# Patient Record
Sex: Male | Born: 1975 | Race: Black or African American | Hispanic: No | Marital: Single | Smoking: Never smoker
Health system: Southern US, Community
[De-identification: ages and names within clinical notes are randomized; demographics above are authoritative.]

## PROBLEM LIST (undated history)

## (undated) DIAGNOSIS — I10 Essential (primary) hypertension: Secondary | ICD-10-CM

## (undated) HISTORY — DX: Essential (primary) hypertension: I10

---

## 1998-01-31 ENCOUNTER — Emergency Department (HOSPITAL_COMMUNITY): Admission: EM | Admit: 1998-01-31 | Discharge: 1998-01-31 | Payer: Self-pay | Admitting: Emergency Medicine

## 2000-10-13 ENCOUNTER — Emergency Department (HOSPITAL_COMMUNITY): Admission: EM | Admit: 2000-10-13 | Discharge: 2000-10-14 | Payer: Self-pay | Admitting: Emergency Medicine

## 2000-12-08 ENCOUNTER — Emergency Department (HOSPITAL_COMMUNITY): Admission: EM | Admit: 2000-12-08 | Discharge: 2000-12-08 | Payer: Self-pay | Admitting: Internal Medicine

## 2002-01-16 ENCOUNTER — Encounter: Payer: Self-pay | Admitting: Emergency Medicine

## 2002-01-16 ENCOUNTER — Emergency Department (HOSPITAL_COMMUNITY): Admission: EM | Admit: 2002-01-16 | Discharge: 2002-01-16 | Payer: Self-pay | Admitting: *Deleted

## 2002-01-16 ENCOUNTER — Encounter: Payer: Self-pay | Admitting: *Deleted

## 2012-07-23 ENCOUNTER — Emergency Department (HOSPITAL_COMMUNITY)
Admission: EM | Admit: 2012-07-23 | Discharge: 2012-07-23 | Disposition: A | Discharge status: Home / Self Care | Payer: Self-pay | Attending: Emergency Medicine | Admitting: Emergency Medicine

## 2012-07-23 ENCOUNTER — Emergency Department (HOSPITAL_COMMUNITY): Payer: Self-pay

## 2012-07-23 ENCOUNTER — Encounter (HOSPITAL_COMMUNITY): Payer: Self-pay | Admitting: *Deleted

## 2012-07-23 DIAGNOSIS — S6990XA Unspecified injury of unspecified wrist, hand and finger(s), initial encounter: Secondary | ICD-10-CM | POA: Insufficient documentation

## 2012-07-23 DIAGNOSIS — F17200 Nicotine dependence, unspecified, uncomplicated: Secondary | ICD-10-CM | POA: Insufficient documentation

## 2012-07-23 DIAGNOSIS — S0010XA Contusion of unspecified eyelid and periocular area, initial encounter: Secondary | ICD-10-CM | POA: Insufficient documentation

## 2012-07-23 LAB — CBC WITH DIFFERENTIAL/PLATELET
Basophils Absolute: 0 10*3/uL (ref 0.0–0.1)
Basophils Relative: 0 % (ref 0–1)
Eosinophils Absolute: 0.1 10*3/uL (ref 0.0–0.7)
Eosinophils Relative: 1 % (ref 0–5)
HCT: 41.3 % (ref 39.0–52.0)
Hemoglobin: 14.3 g/dL (ref 13.0–17.0)
Lymphocytes Relative: 20 % (ref 12–46)
Lymphs Abs: 2.1 10*3/uL (ref 0.7–4.0)
MCH: 27.9 pg (ref 26.0–34.0)
MCHC: 34.6 g/dL (ref 30.0–36.0)
MCV: 80.5 fL (ref 78.0–100.0)
Monocytes Absolute: 0.7 10*3/uL (ref 0.1–1.0)
Monocytes Relative: 7 % (ref 3–12)
Neutro Abs: 7.3 10*3/uL (ref 1.7–7.7)
Neutrophils Relative %: 72 % (ref 43–77)
Platelets: 297 10*3/uL (ref 150–400)
RBC: 5.13 MIL/uL (ref 4.22–5.81)
RDW: 13.3 % (ref 11.5–15.5)
WBC: 10.2 10*3/uL (ref 4.0–10.5)

## 2012-07-23 LAB — BASIC METABOLIC PANEL
BUN: 10 mg/dL (ref 6–23)
CO2: 29 mEq/L (ref 19–32)
Calcium: 9.5 mg/dL (ref 8.4–10.5)
Chloride: 106 mEq/L (ref 96–112)
Creatinine, Ser: 1.1 mg/dL (ref 0.50–1.35)
GFR calc Af Amer: >90 mL/min (ref >90)
GFR calc non Af Amer: 85 mL/min — ABNORMAL LOW (ref >90)
Glucose, Bld: 92 mg/dL (ref 70–99)
Potassium: 3.7 mEq/L (ref 3.5–5.1)
Sodium: 143 mEq/L (ref 135–145)

## 2012-07-23 MED ORDER — AMOXICILLIN-POT CLAVULANATE 875-125 MG PO TABS: 1.0000 | ORAL_TABLET | Freq: Two times a day (BID) | ORAL | Status: DC

## 2012-07-23 MED ORDER — AMPICILLIN-SULBACTAM SODIUM 3 (2-1) G IJ SOLR
3.0000 g | Freq: Once | INTRAMUSCULAR | Status: AC
  Administered 2012-07-23: 3 g via INTRAVENOUS
  Filled 2012-07-23: qty 3

## 2012-07-23 MED ORDER — OXYCODONE-ACETAMINOPHEN 5-325 MG PO TABS: 1.0000 | ORAL_TABLET | ORAL | Status: DC | PRN
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
## 2014-11-22 ENCOUNTER — Other Ambulatory Visit (HOSPITAL_COMMUNITY)
Admission: RE | Admit: 2014-11-22 | Discharge: 2014-11-22 | Disposition: A | Discharge status: Home / Self Care | Payer: Self-pay | Source: Ambulatory Visit | Attending: Family Medicine | Admitting: Family Medicine

## 2014-11-22 ENCOUNTER — Emergency Department (HOSPITAL_COMMUNITY)
Admission: EM | Admit: 2014-11-22 | Discharge: 2014-11-22 | Disposition: A | Discharge status: Home / Self Care | Payer: Self-pay | Source: Home / Self Care | Attending: Family Medicine | Admitting: Family Medicine

## 2014-11-22 ENCOUNTER — Encounter (HOSPITAL_COMMUNITY): Payer: Self-pay | Admitting: Emergency Medicine

## 2014-11-22 DIAGNOSIS — Z113 Encounter for screening for infections with a predominantly sexual mode of transmission: Secondary | ICD-10-CM | POA: Insufficient documentation

## 2014-11-22 DIAGNOSIS — IMO0001 Reserved for inherently not codable concepts without codable children: Secondary | ICD-10-CM

## 2014-11-22 DIAGNOSIS — J329 Chronic sinusitis, unspecified: Secondary | ICD-10-CM

## 2014-11-22 DIAGNOSIS — R0982 Postnasal drip: Secondary | ICD-10-CM

## 2014-11-22 DIAGNOSIS — R030 Elevated blood-pressure reading, without diagnosis of hypertension: Secondary | ICD-10-CM

## 2014-11-22 DIAGNOSIS — R059 Cough, unspecified: Secondary | ICD-10-CM

## 2014-11-22 DIAGNOSIS — R05 Cough: Secondary | ICD-10-CM

## 2014-11-22 DIAGNOSIS — Z202 Contact with and (suspected) exposure to infections with a predominantly sexual mode of transmission: Secondary | ICD-10-CM

## 2014-11-22 MED ORDER — CEFTRIAXONE SODIUM 250 MG IJ SOLR
250.0000 mg | Freq: Once | INTRAMUSCULAR | Status: AC
  Administered 2014-11-22: 250 mg via INTRAMUSCULAR

## 2014-11-22 MED ORDER — CEFTRIAXONE SODIUM 250 MG IJ SOLR
INTRAMUSCULAR | Status: AC
  Filled 2014-11-22: qty 250

## 2014-11-22 MED ORDER — AZITHROMYCIN 250 MG PO TABS
ORAL_TABLET | ORAL | Status: AC
  Filled 2014-11-22: qty 4

## 2014-11-22 MED ORDER — METRONIDAZOLE 500 MG PO TABS: 500.0000 mg | ORAL_TABLET | Freq: Two times a day (BID) | ORAL | Status: DC

## 2014-11-22 MED ORDER — AZITHROMYCIN 250 MG PO TABS
1000.0000 mg | ORAL_TABLET | Freq: Once | ORAL | Status: AC
  Administered 2014-11-22: 1000 mg via ORAL

## 2014-11-22 MED ORDER — IPRATROPIUM BROMIDE 0.06 % NA SOLN
2.0000 | Freq: Four times a day (QID) | NASAL | Status: DC
Start: 2014-11-22 — End: 2016-12-22

## 2014-11-22 MED ORDER — LIDOCAINE HCL (PF) 1 % IJ SOLN
INTRAMUSCULAR | Status: AC
  Filled 2014-11-22: qty 5

## 2014-11-22 NOTE — Discharge Instructions (Signed)
You have been treated for gonorrhea and chlamydia at [facility] today. Please use medications as prescribed to treat possible exposure to trichomonas and for your post nasal drainage. No sex x 2 weeks. Use condoms. You should also be aware that your blood pressure is quite elevated (170/118) and this should be evaluated and treated by the primary care provider of your choice as soon as possible. No alcohol while taking metronidazole.   Cough, Adult  A cough is a reflex that helps clear your throat and airways. It can help heal the body or may be a reaction to an irritated airway. A cough may only last 2 or 3 weeks (acute) or may last more than 8 weeks (chronic).  CAUSES Acute cough:  Viral or bacterial infections. Chronic cough:  Infections.  Allergies.  Asthma.  Post-nasal drip.  Smoking.  Heartburn or acid reflux.  Some medicines.  Chronic lung problems (COPD).  Cancer. SYMPTOMS   Cough.  Fever.  Chest pain.  Increased breathing rate.  High-pitched whistling sound when breathing (wheezing).  Colored mucus that you cough up (sputum). TREATMENT   A bacterial cough may be treated with antibiotic medicine.  A viral cough must run its course and will not respond to antibiotics.  Your caregiver may recommend other treatments if you have a chronic cough. HOME CARE INSTRUCTIONS   Only take over-the-counter or prescription medicines for pain, discomfort, or fever as directed by your caregiver. Use cough suppressants only as directed by your caregiver.  Use a cold steam vaporizer or humidifier in your bedroom or home to help loosen secretions.  Sleep in a semi-upright position if your cough is worse at night.  Rest as needed.  Stop smoking if you smoke. SEEK IMMEDIATE MEDICAL CARE IF:   You have pus in your sputum.  Your cough starts to worsen.  You cannot control your cough with suppressants and are losing sleep.  You begin coughing up blood.  You have difficulty  breathing.  You develop pain which is getting worse or is uncontrolled with medicine.  You have a fever. MAKE SURE YOU:   Understand these instructions.  Will watch your condition.  Will get help right away if you are not doing well or get worse. Document Released: 03/01/2011 Document Revised: 11/25/2011 Document Reviewed: 03/01/2011 [facility] Patient Information 2015 [place], [place]. This information is not intended to replace advice given to you by your health care provider. Make sure you discuss any questions you have with your health care provider.  Hypertension Hypertension, commonly called high blood pressure, is when the force of blood pumping through your arteries is too strong. Your arteries are the blood vessels that carry blood from your heart throughout your body. A blood pressure reading consists of a higher number over a lower number, such as 110/72. The higher number (systolic) is the pressure inside your arteries when your heart pumps. The lower number (diastolic) is the pressure inside your arteries when your heart relaxes. Ideally you want your blood pressure below 120/80. Hypertension forces your heart to work harder to pump blood. Your arteries may become narrow or stiff. Having hypertension puts you at risk for heart disease, stroke, and other problems.  RISK FACTORS Some risk factors for high blood pressure are controllable. Others are not.  Risk factors you cannot control include:   Race. You may be at higher risk if you are African American.  Age. Risk increases with age.  Gender. Men are at higher risk than women before age  45 years. After age 56, women are at higher risk than men. Risk factors you can control include:  Not getting enough exercise or physical activity.  Being overweight.  Getting too much fat, sugar, calories, or salt in your diet.  Drinking too much alcohol. SIGNS AND SYMPTOMS Hypertension does not usually cause signs or symptoms.  Extremely high blood pressure (hypertensive crisis) may cause headache, anxiety, shortness of breath, and nosebleed. DIAGNOSIS  To check if you have hypertension, your health care provider will measure your blood pressure while you are seated, with your arm held at the level of your heart. It should be measured at least twice using the same arm. Certain conditions can cause a difference in blood pressure between your right and left arms. A blood pressure reading that is higher than normal on one occasion does not mean that you need treatment. If one blood pressure reading is high, ask your health care provider about having it checked again. TREATMENT  Treating high blood pressure includes making lifestyle changes and possibly taking medicine. Living a healthy lifestyle can help lower high blood pressure. You may need to change some of your habits. Lifestyle changes may include:  Following the DASH diet. This diet is high in fruits, vegetables, and whole grains. It is low in salt, red meat, and added sugars.  Getting at least 2 hours of brisk physical activity every week.  Losing weight if necessary.  Not smoking.  Limiting alcoholic beverages.  Learning ways to reduce stress. If lifestyle changes are not enough to get your blood pressure under control, your health care provider may prescribe medicine. You may need to take more than one. Work closely with your health care provider to understand the risks and benefits. HOME CARE INSTRUCTIONS  Have your blood pressure rechecked as directed by your health care provider.   Take medicines only as directed by your health care provider. Follow the directions carefully. Blood pressure medicines must be taken as prescribed. The medicine does not work as well when you skip doses. Skipping doses also puts you at risk for problems.   Do not smoke.   Monitor your blood pressure at home as directed by your health care provider. SEEK MEDICAL CARE  IF:   You think you are having a reaction to medicines taken.  You have recurrent headaches or feel dizzy.  You have swelling in your ankles.  You have trouble with your vision. SEEK IMMEDIATE MEDICAL CARE IF:  You develop a severe headache or confusion.  You have unusual weakness, numbness, or feel faint.  You have severe chest or abdominal pain.  You vomit repeatedly.  You have trouble breathing. MAKE SURE YOU:   Understand these instructions.  Will watch your condition.  Will get help right away if you are not doing well or get worse. Document Released: 09/02/2005 Document Revised: 01/17/2014 Document Reviewed: 06/25/2013 [facility] Patient Information 2015 [place], [place]. This information is not intended to replace advice given to you by your health care provider. Make sure you discuss any questions you have with your health care provider.  Managing Your High Blood Pressure Blood pressure is a measurement of how forceful your blood is pressing against the walls of the arteries. Arteries are muscular tubes within the circulatory system. Blood pressure does not stay the same. Blood pressure rises when you are active, excited, or nervous; and it lowers during sleep and relaxation. If the numbers measuring your blood pressure stay above normal most of the time, you  are at risk for health problems. High blood pressure (hypertension) is a long-term (chronic) condition in which blood pressure is elevated. A blood pressure reading is recorded as two numbers, such as 120 over 80 (or 120/80). The first, higher number is called the systolic pressure. It is a measure of the pressure in your arteries as the heart beats. The second, lower number is called the diastolic pressure. It is a measure of the pressure in your arteries as the heart relaxes between beats.  Keeping your blood pressure in a normal range is important to your overall health and prevention of health problems, such as heart  disease and stroke. When your blood pressure is uncontrolled, your heart has to work harder than normal. High blood pressure is a very common condition in adults because blood pressure tends to rise with age. Men and women are equally likely to have hypertension but at different times in life. Before age 67, men are more likely to have hypertension. After 39 years of age, women are more likely to have it. Hypertension is especially common in African Americans. This condition often has no signs or symptoms. The cause of the condition is usually not known. Your caregiver can help you come up with a plan to keep your blood pressure in a normal, healthy range. BLOOD PRESSURE STAGES Blood pressure is classified into four stages: normal, prehypertension, stage 1, and stage 2. Your blood pressure reading will be used to determine what type of treatment, if any, is necessary. Appropriate treatment options are tied to these four stages:  Normal  Systolic pressure (mm Hg): below 120.  Diastolic pressure (mm Hg): below 80. Prehypertension  Systolic pressure (mm Hg): 120 to 139.  Diastolic pressure (mm Hg): 80 to 89. Stage1  Systolic pressure (mm Hg): 140 to 159.  Diastolic pressure (mm Hg): 90 to 99. Stage2  Systolic pressure (mm Hg): 160 or above.  Diastolic pressure (mm Hg): 100 or above. RISKS RELATED TO HIGH BLOOD PRESSURE Managing your blood pressure is an important responsibility. Uncontrolled high blood pressure can lead to:  A heart attack.  A stroke.  A weakened blood vessel (aneurysm).  Heart failure.  Kidney damage.  Eye damage.  Metabolic syndrome.  Memory and concentration problems. HOW TO MANAGE YOUR BLOOD PRESSURE Blood pressure can be managed effectively with lifestyle changes and medicines (if needed). Your caregiver will help you come up with a plan to bring your blood pressure within a normal range. Your plan should include the following: Education  Read all  information provided by your caregivers about how to control blood pressure.  Educate yourself on the latest guidelines and treatment recommendations. New research is always being done to further define the risks and treatments for high blood pressure. Lifestylechanges  Control your weight.  Avoid smoking.  Stay physically active.  Reduce the amount of salt in your diet.  Reduce stress.  Control any chronic conditions, such as high cholesterol or diabetes.  Reduce your alcohol intake. Medicines  Several medicines (antihypertensive medicines) are available, if needed, to bring blood pressure within a normal range. Communication  Review all the medicines you take with your caregiver because there may be side effects or interactions.  Talk with your caregiver about your diet, exercise habits, and other lifestyle factors that may be contributing to high blood pressure.  See your caregiver regularly. Your caregiver can help you create and adjust your plan for managing high blood pressure. RECOMMENDATIONS FOR TREATMENT AND FOLLOW-UP  The following  recommendations are based on current guidelines for managing high blood pressure in nonpregnant adults. Use these recommendations to identify the proper follow-up period or treatment option based on your blood pressure reading. You can discuss these options with your caregiver.  Systolic pressure of 120 to 139 or diastolic pressure of 80 to 89: Follow up with your caregiver as directed.  Systolic pressure of 140 to 160 or diastolic pressure of 90 to 100: Follow up with your caregiver within 2 months.  Systolic pressure above 160 or diastolic pressure above 100: Follow up with your caregiver within 1 month.  Systolic pressure above 180 or diastolic pressure above 110: Consider antihypertensive therapy; follow up with your caregiver within 1 week.  Systolic pressure above 200 or diastolic pressure above 120: Begin antihypertensive therapy;  follow up with your caregiver within 1 week. Document Released: 05/27/2012 Document Reviewed: 05/27/2012 [facility] Patient Information 2015 [place], [place]. This information is not intended to replace advice given to you by your health care provider. Make sure you discuss any questions you have with your health care provider.  Safe Sex Safe sex is about reducing the risk of giving or getting a sexually transmitted disease (STD). STDs are spread through sexual contact involving the genitals, mouth, or rectum. Some STDs can be cured and others cannot. Safe sex can also prevent unintended pregnancies.  WHAT ARE SOME SAFE SEX PRACTICES?  Limit your sexual activity to only one partner who is having sex with only you.  Talk to your partner about his or her past partners, past STDs, and drug use.  Use a condom every time you have sexual intercourse. This includes vaginal, oral, and anal sexual activity. Both females and males should wear condoms during oral sex. Only use latex or polyurethane condoms and water-based lubricants. Using petroleum-based lubricants or oils to lubricate a condom will weaken the condom and increase the chance that it will break. The condom should be in place from the beginning to the end of sexual activity. Wearing a condom reduces, but does not completely eliminate, your risk of getting or giving an STD. STDs can be spread by contact with infected body fluids and skin.  Get vaccinated for hepatitis B and HPV.  Avoid alcohol and recreational drugs, which can affect your judgment. You may forget to use a condom or participate in high-risk sex.  For females, avoid douching after sexual intercourse. Douching can spread an infection farther into the reproductive tract.  Check your body for signs of sores, blisters, rashes, or unusual discharge. See your health care provider if you notice any of these signs.  Avoid sexual contact if you have symptoms of an infection or are being  treated for an STD. If you or your partner has herpes, avoid sexual contact when blisters are present. Use condoms at all other times.  If you are at risk of being infected with HIV, it is recommended that you take a prescription medicine daily to prevent HIV infection. This is called pre-exposure prophylaxis (PrEP). You are considered at risk if:  You are a man who has sex with other men (MSM).  You are a heterosexual man or woman who is sexually active with more than one partner.  You take drugs by injection.  You are sexually active with a partner who has HIV.  Talk with your health care provider about whether you are at high risk of being infected with HIV. If you choose to begin PrEP, you should first be tested for HIV.  You should then be tested every 3 months for as long as you are taking PrEP.  See your health care provider for regular screenings, exams, and tests for other STDs. Before having sex with a new partner, each of you should be screened for STDs and should talk about the results with each other. WHAT ARE THE BENEFITS OF SAFE SEX?   There is less chance of getting or giving an STD.  You can prevent unwanted or unintended pregnancies.  By discussing safe sex concerns with your partner, you may increase feelings of intimacy, comfort, trust, and honesty between the two of you. Document Released: 10/10/2004 Document Revised: 01/17/2014 Document Reviewed: 02/24/2012 [facility] Patient Information 2015 [place]. This information is not intended to replace advice given to you by your health care provider. Make sure you discuss any questions you have with your health care provider.  Sexually Transmitted Disease A sexually transmitted disease (STD) is a disease or infection that may be passed (transmitted) from person to person, usually during sexual activity. This may happen by way of saliva, semen, blood, vaginal mucus, or urine. Common STDs include:   Gonorrhea.    Chlamydia.   Syphilis.   HIV and AIDS.   Genital herpes.   Hepatitis B and C.   Trichomonas.   Human papillomavirus (HPV).   Pubic lice.   Scabies.  Mites.  Bacterial vaginosis. WHAT ARE CAUSES OF STDs? An STD may be caused by bacteria, a virus, or parasites. STDs are often transmitted during sexual activity if one person is infected. However, they may also be transmitted through nonsexual means. STDs may be transmitted after:   Sexual intercourse with an infected person.   Sharing sex toys with an infected person.   Sharing needles with an infected person or using unclean piercing or tattoo needles.  Having intimate contact with the genitals, mouth, or rectal areas of an infected person.   Exposure to infected fluids during birth. WHAT ARE THE SIGNS AND SYMPTOMS OF STDs? Different STDs have different symptoms. Some people may not have any symptoms. If symptoms are present, they may include:   Painful or bloody urination.   Pain in the pelvis, abdomen, vagina, anus, throat, or eyes.   A skin rash, itching, or irritation.  Growths, ulcerations, blisters, or sores in the genital and anal areas.  Abnormal vaginal discharge with or without bad odor.   Penile discharge in men.   Fever.   Pain or bleeding during sexual intercourse.   Swollen glands in the groin area.   Yellow skin and eyes (jaundice). This is seen with hepatitis.   Swollen testicles.  Infertility.  Sores and blisters in the mouth. HOW ARE STDs DIAGNOSED? To make a diagnosis, your health care provider may:   Take a medical history.   Perform a physical exam.   Take a sample of any discharge to examine.  Swab the throat, cervix, opening to the penis, rectum, or vagina for testing.  Test a sample of your first morning urine.   Perform blood tests.   Perform a Pap test, if this applies.   Perform a colposcopy.   Perform a laparoscopy.  HOW ARE STDs  TREATED? Treatment depends on the STD. Some STDs may be treated but not cured.   Chlamydia, gonorrhea, trichomonas, and syphilis can be cured with antibiotic medicine.   Genital herpes, hepatitis, and HIV can be treated, but not cured, with prescribed medicines. The medicines lessen symptoms.   Genital warts from HPV can  be treated with medicine or by freezing, burning (electrocautery), or surgery. Warts may come back.   HPV cannot be cured with medicine or surgery. However, abnormal areas may be removed from the cervix, vagina, or vulva.   If your diagnosis is confirmed, your recent sexual partners need treatment. This is true even if they are symptom-free or have a negative culture or evaluation. They should not have sex until their health care providers say it is okay. HOW CAN I REDUCE MY RISK OF GETTING AN STD? Take these steps to reduce your risk of getting an STD:  Use latex condoms, dental dams, and water-soluble lubricants during sexual activity. Do not use petroleum jelly or oils.  Avoid having multiple sex partners.  Do not have sex with someone who has other sex partners.  Do not have sex with anyone you do not know or who is at high risk for an STD.  Avoid risky sex practices that can break your skin.  Do not have sex if you have open sores on your mouth or skin.  Avoid drinking too much alcohol or taking illegal drugs. Alcohol and drugs can affect your judgment and put you in a vulnerable position.  Avoid engaging in oral and anal sex acts.  Get vaccinated for HPV and hepatitis. If you have not received these vaccines in the past, talk to your health care provider about whether one or both might be right for you.   If you are at risk of being infected with HIV, it is recommended that you take a prescription medicine daily to prevent HIV infection. This is called pre-exposure prophylaxis (PrEP). You are considered at risk if:  You are a man who has sex with other  men (MSM).  You are a heterosexual man or woman and are sexually active with more than one partner.  You take drugs by injection.  You are sexually active with a partner who has HIV.  Talk with your health care provider about whether you are at high risk of being infected with HIV. If you choose to begin PrEP, you should first be tested for HIV. You should then be tested every 3 months for as long as you are taking PrEP.  WHAT SHOULD I DO IF I THINK I HAVE AN STD?  See your health care provider.   Tell your sexual partner(s). They should be tested and treated for any STDs.  Do not have sex until your health care provider says it is okay. WHEN SHOULD I GET IMMEDIATE MEDICAL CARE? Contact your health care provider right away if:   You have severe abdominal pain.  You are a man and notice swelling or pain in your testicles.  You are a woman and notice swelling or pain in your vagina. Document Released: 11/23/2002 Document Revised: 09/07/2013 Document Reviewed: 03/23/2013 [facility] Patient Information 2015 [place], [place]. This information is not intended to replace advice given to you by your health care provider. Make sure you discuss any questions you have with your health care provider.

## 2014-11-22 NOTE — ED Provider Notes (Signed)
CSN: [id number]     Arrival date & time 11/22/14  1030 History   First MD Initiated Contact with Patient 11/22/14 1053  [id number]   Chief Complaint  Patient presents with  . Cough  . Exposure to STD   (Consider location/radiation/quality/duration/timing/severity/associated sxs/prior Treatment) HPI Comments: Patient requests evaluation of two issues. First he states he has had several weeks of post nasal drainage that precipitates a cough. Has tried using occasional dose of Zyrtec with limited relief.  Next he mentions that his girlfriend recently informed him that she tested positive for trichomonas and he now requests STI testing. Reports himself to be symptom free. No genital lesions, penile discharge or dysuria.  Reports himself to be otherwise healthy.   Patient is a 39 y.o. male presenting with STD exposure. The history is provided by the patient.  Exposure to STD    History reviewed. No pertinent past medical history. History reviewed. No pertinent past surgical history. History reviewed. No pertinent family history. History  Substance Use Topics  . Smoking status: Current Every Day Smoker  . Smokeless tobacco: Not on file  . Alcohol Use: Yes    Review of Systems  All other systems reviewed and are negative.   Allergies  Review of patient's allergies indicates no known allergies.  Home Medications   Prior to Admission medications   Medication Sig Start Date End Date Taking? Authorizing Provider  amoxicillin-clavulanate (AUGMENTIN) 875-125 MG per tablet Take 1 tablet by mouth 2 (two) times daily. 07/23/12   Raeford RazorStephen Kohut, MD  Aspirin-Caffeine [phone] [id number] MG PACK Take by mouth once. Pain    Historical Provider, MD  ipratropium (ATROVENT) 0.06 % nasal spray Place 2 sprays into both nostrils 4 (four) times daily. For nasal congestion 11/22/14   Mathis FareJennifer Lee H Marguarite Markov, PA  metroNIDAZOLE (FLAGYL) 500 MG tablet Take 1 tablet (500 mg total) by mouth 2 (two) times daily. 11/22/14   Mathis FareJennifer Lee H  Jeyden Coffelt, PA  oxyCODONE-acetaminophen (PERCOCET/ROXICET) 5-325 MG per tablet Take 1 tablet by mouth every 4 (four) hours as needed for pain. 07/23/12   Raeford RazorStephen Kohut, MD   BP 170/118 mmHg  Pulse 85  Temp(Src) 98.7 F (37.1 C) (Oral)  Resp 16  SpO2 95% Physical Exam  Constitutional: He is oriented to person, place, and time. He appears well-developed and well-nourished. No distress.  HENT:  Head: Normocephalic and atraumatic.  Right Ear: Hearing, tympanic membrane, external ear and ear canal normal.  Left Ear: Hearing, tympanic membrane, external ear and ear canal normal.  Nose: Nose normal.  Mouth/Throat: Uvula is midline, oropharynx is clear and moist and mucous membranes are normal.  Cardiovascular: Normal rate, regular rhythm and normal heart sounds.   Pulmonary/Chest: Effort normal and breath sounds normal.  Genitourinary:  Declines this portion of exam  Neurological: He is alert and oriented to person, place, and time.  Skin: Skin is warm and dry.  Psychiatric: He has a normal mood and affect. His behavior is normal.  Nursing note and vitals reviewed.   ED Course  Procedures (including critical care time) Labs Review Labs Reviewed  URINE CYTOLOGY ANCILLARY ONLY    Imaging Review No results found.   MDM   1. Post-nasal drainage   2. Cough   3. Exposure to STD   4. Elevated blood pressure   [facility]ith[facility] 1000mg  and ceftriaxone 250 mg IM. Metronidazole for trich exposure and Atrovent for nasal drainage. No oral decongestants advised given patient's elevated  blood pressure. You have been treated for gonorrhea and chlamydia at [facility] today. Please use medications as prescribed to treat possible exposure to trichomonas and for your post nasal drainage. No sex x 2 weeks. Use condoms. You should also be aware that your blood pressure is quite elevated (170/118) and this should be evaluated and treated by the primary care  provider of your choice as soon as possible. No alcohol while taking metronidazole.     Ria Clock, [place] 11/22/14 [phone]

## 2014-11-22 NOTE — ED Notes (Signed)
C/o  Non productive cough, post nasal drip  x 2 weeks.   Pt having no relief with doing zyrtec daily and using nite quil.    Pt also reports being told by sexual partner that she tested positive for trich.  Pt was informed 10 days ago.  Pt is asymptomatic at this time.

## 2014-11-23 LAB — URINE CYTOLOGY ANCILLARY ONLY
CHLAMYDIA, DNA PROBE: NEGATIVE
Neisseria Gonorrhea: NEGATIVE
TRICH (WINDOWPATH): NEGATIVE

## 2014-11-23 NOTE — ED Notes (Signed)
Trichomonas, GC, chlamydia negative. No further action required

## 2016-12-22 ENCOUNTER — Encounter (HOSPITAL_COMMUNITY): Payer: Self-pay | Admitting: Emergency Medicine

## 2016-12-22 ENCOUNTER — Ambulatory Visit (HOSPITAL_COMMUNITY)
Admission: EM | Admit: 2016-12-22 | Discharge: 2016-12-22 | Disposition: A | Discharge status: Home / Self Care | Payer: Self-pay | Attending: Family Medicine | Admitting: Family Medicine

## 2016-12-22 DIAGNOSIS — L0231 Cutaneous abscess of buttock: Secondary | ICD-10-CM

## 2016-12-22 MED ORDER — DOXYCYCLINE HYCLATE 100 MG PO CAPS: 100.0000 mg | ORAL_CAPSULE | Freq: Two times a day (BID) | ORAL | 0 refills | Status: DC

## 2016-12-22 NOTE — Discharge Instructions (Signed)
Can do warm tub soaks 2-3 times daily.  Complete the full course of antibiotic that was given today.

## 2016-12-22 NOTE — ED Provider Notes (Signed)
CSN: [id number]     Arrival date & time 12/22/16  1435 History   None    Chief Complaint  Patient presents with  . Abscess   (Consider location/radiation/quality/duration/timing/severity/associated sxs/prior Treatment) HPI 41 year old black male comes in today with complaints of large left buttock abscess. States that he noticed this about 3-4 days ago. No injury. Pain mostly when he is sitting. Has not had this problem before onset of symptoms. Denies drainage from the area. Denies fever, chills, bowel or bladder changes. History reviewed. No pertinent past medical history. History reviewed. No pertinent surgical history. History reviewed. No pertinent family history. Social History  Substance Use Topics  . Smoking status: Current Every Day Smoker  . Smokeless tobacco: Never Used  . Alcohol use Yes    Review of Systems  Constitutional: Negative.   HENT: Negative.   Respiratory: Negative.   Gastrointestinal: Negative.   Genitourinary: Negative.   Skin:       Abscess left buttock  Neurological: Negative.   Psychiatric/Behavioral: Negative.     Allergies  Patient has no known allergies.  Home Medications   Prior to Admission medications   Medication Sig Start Date End Date Taking? Authorizing Provider  doxycycline (VIBRAMYCIN) 100 MG capsule Take 1 capsule (100 mg total) by mouth 2 (two) times daily. 12/22/16   Naida Sleight, PA-C   Meds Ordered and Administered this Visit  Medications - No data to display  BP (!) 163/106 (BP Location: Right Arm)   Pulse 92   Temp 98.7 F (37.1 C) (Oral)   Resp 18   SpO2 99%  No data found.   Physical Exam  Constitutional: He is oriented to person, place, and time. He appears well-developed. No distress.  HENT:  Head: Normocephalic and atraumatic.  Eyes: EOM are normal. Pupils are equal, round, and reactive to light.  Neck: Normal range of motion.  Pulmonary/Chest: Effort normal. No respiratory distress.  Abdominal: He exhibits  no distension. There is no tenderness.  Musculoskeletal: Normal range of motion.  Neurological: He is alert and oriented to person, place, and time.  Skin: Skin is warm.  Patient is a large tender gluteal abscess about 4 cm in diameter. No drainage.  Psychiatric: He has a normal mood and affect.    Urgent Care Course     Procedures (including critical care time)  Labs Review Labs Reviewed - No data to display  Imaging Review No results found.   Visual Acuity Review  Right Eye Distance:   Left Eye Distance:   Bilateral Distance:    Right Eye Near:   Left Eye Near:    Bilateral Near:         MDM   1. Abscess, gluteal, left    Today the patient sent area of left gluteal abscess was prepped with Betadine and using 1 mL 1% Xylocaine with epi for local anesthetic I was able to aspirate about 5 mL of bloody purulent material. Tolerated procedure without complication. Patient will do warm tub soaks 2-3 times daily. I did give her prescription for doxycycline 100 mg by mouth twice a day 10 days. If not better or symptoms worsen over the next 4-5 days he'll return back to our clinic. All questions answered.    Naida Sleight, PA-C 12/22/16 [phone]

## 2016-12-22 NOTE — ED Triage Notes (Signed)
The patient presented to the [facility] with a complaint of a possible abscess on the top of his buttocks x 3 days.

## 2017-03-17 ENCOUNTER — Ambulatory Visit (HOSPITAL_COMMUNITY)
Admission: EM | Admit: 2017-03-17 | Discharge: 2017-03-17 | Disposition: A | Discharge status: Home / Self Care | Payer: Self-pay | Attending: Family Medicine | Admitting: Family Medicine

## 2017-03-17 ENCOUNTER — Encounter (HOSPITAL_COMMUNITY): Payer: Self-pay | Admitting: Family Medicine

## 2017-03-17 DIAGNOSIS — S76212A Strain of adductor muscle, fascia and tendon of left thigh, initial encounter: Secondary | ICD-10-CM

## 2017-03-17 MED ORDER — IBUPROFEN 800 MG PO TABS: 800.0000 mg | ORAL_TABLET | Freq: Three times a day (TID) | ORAL | 0 refills | Status: DC

## 2017-03-17 NOTE — ED Triage Notes (Signed)
Pt for left groin pain and left upper leg pain and weakness x a few days. Denies injury

## 2017-03-17 NOTE — ED Provider Notes (Signed)
CSN: [id number]     Arrival date & time 03/17/17  1201 History   First MD Initiated Contact with Patient 03/17/17 1254     Chief Complaint  Patient presents with  . Groin Pain   (Consider location/radiation/quality/duration/timing/severity/associated sxs/prior Treatment) Patient c/o left groin pain x 2 days   The history is provided by the patient.  Groin Pain  This is a new problem. The current episode started more than 2 days ago. The problem occurs constantly. The problem has not changed since onset.Nothing aggravates the symptoms. Nothing relieves the symptoms. He has tried nothing for the symptoms.    History reviewed. No pertinent past medical history. History reviewed. No pertinent surgical history. History reviewed. No pertinent family history. Social History  Substance Use Topics  . Smoking status: Current Every Day Smoker  . Smokeless tobacco: Never Used  . Alcohol use Yes    Review of Systems  Constitutional: Negative.   HENT: Negative.   Eyes: Negative.   Respiratory: Negative.   Cardiovascular: Negative.   Gastrointestinal: Negative.   Endocrine: Negative.   Genitourinary: Negative.   Musculoskeletal: Positive for arthralgias.  Allergic/Immunologic: Negative.   Neurological: Negative.   Hematological: Negative.   Psychiatric/Behavioral: Negative.     Allergies  Patient has no known allergies.  Home Medications   Prior to Admission medications   Medication Sig Start Date End Date Taking? Authorizing Provider  doxycycline (VIBRAMYCIN) 100 MG capsule Take 1 capsule (100 mg total) by mouth 2 (two) times daily. 12/22/16   Naida Sleightwens, James M, PA-C  ibuprofen (ADVIL,MOTRIN) 800 MG tablet Take 1 tablet (800 mg total) by mouth 3 (three) times daily. 03/17/17   Deatra Canterxford, William J, FNP   Meds Ordered and Administered this Visit  Medications - No data to display  BP (!) 164/93 (BP Location: Left Arm) Comment: rn notified   Pulse 99   Temp 98.5 F (36.9 C) (Oral)    Resp 16   SpO2 97%  No data found.   Physical Exam  Constitutional: He is oriented to person, place, and time. He appears well-developed and well-nourished.  HENT:  Head: Normocephalic and atraumatic.  Eyes: Conjunctivae and EOM are normal. Pupils are equal, round, and reactive to light.  Neck: Normal range of motion. Neck supple.  Cardiovascular: Normal rate, regular rhythm and normal heart sounds.   Pulmonary/Chest: Effort normal and breath sounds normal.  Abdominal: Soft. Bowel sounds are normal.  Musculoskeletal: Normal range of motion.  Neurological: He is alert and oriented to person, place, and time.  Vitals reviewed.   Urgent Care Course     Procedures (including critical care time)  Labs Review Labs Reviewed  URINE CULTURE    Imaging Review No results found.   Visual Acuity Review  Right Eye Distance:   Left Eye Distance:   Bilateral Distance:    Right Eye Near:   Left Eye Near:    Bilateral Near:         MDM   1. Groin strain, left, initial encounter    Motrin 800mg  one po tid #30      Deatra CanterOxford, William J, [place]FNP 03/17/17 1509

## 2017-11-05 ENCOUNTER — Encounter (HOSPITAL_COMMUNITY): Payer: Self-pay | Admitting: Family Medicine

## 2017-11-05 ENCOUNTER — Ambulatory Visit (HOSPITAL_COMMUNITY)
Admission: EM | Admit: 2017-11-05 | Discharge: 2017-11-05 | Disposition: A | Discharge status: Home / Self Care | Payer: BLUE CROSS/BLUE SHIELD | Attending: Family Medicine | Admitting: Family Medicine

## 2017-11-05 DIAGNOSIS — R0981 Nasal congestion: Secondary | ICD-10-CM | POA: Diagnosis not present

## 2017-11-05 DIAGNOSIS — L309 Dermatitis, unspecified: Secondary | ICD-10-CM

## 2017-11-05 MED ORDER — TRIAMCINOLONE ACETONIDE 0.1 % EX CREA: 1.0000 "application " | TOPICAL_CREAM | Freq: Two times a day (BID) | CUTANEOUS | 1 refills | Status: DC

## 2017-11-05 MED ORDER — METHYLPREDNISOLONE SODIUM SUCC 125 MG IJ SOLR
125.0000 mg | Freq: Once | INTRAMUSCULAR | Status: AC
  Administered 2017-11-05: 125 mg via INTRAMUSCULAR

## 2017-11-05 MED ORDER — METHYLPREDNISOLONE SODIUM SUCC 125 MG IJ SOLR: 80.0000 mg | Freq: Once | INTRAMUSCULAR | Status: DC

## 2017-11-05 MED ORDER — METHYLPREDNISOLONE SODIUM SUCC 125 MG IJ SOLR
INTRAMUSCULAR | Status: AC
  Filled 2017-11-05: qty 2

## 2017-11-05 NOTE — ED Provider Notes (Signed)
  [facility]   [id number] 11/05/17 Arrival Time: 1046  ASSESSMENT & PLAN:  1. Nasal congestion   2. Eczema, unspecified type     Meds ordered this encounter  Medications  . methylPREDNISolone sodium succinate (SOLU-MEDROL) 125 mg/2 mL injection 125 mg  . triamcinolone cream (KENALOG) 0.1 %    Sig: Apply 1 application topically 2 (two) times daily.    Dispense:  45 g    Refill:  1   OTC allergy medication daily. May f/u with PCP or here as needed.  Reviewed expectations re: course of current medical issues. Questions answered. Outlined signs and symptoms indicating need for more acute intervention. Patient verbalized understanding. After Visit Summary given.   SUBJECTIVE: History from: patient.  Victor Harvey is a 42 y.o. male who presents with complaint of nasal congestion, post-nasal drainage. Nasal congestion bothering him the most. Frequent sneezing. Onset gradual, approximately 2 weeks ago. SOB: none. Wheezing: none. Fever: no. Overall normal PO intake without n/v. Sick contacts: no. OTC treatment: Allergy meds with very mild help. "Usually get a steroid shot and it clears this up."  Also with h/o eczema. Currently on upper back. Very dry skin. Itching. Requests steroid cream.  Social History   Tobacco Use  Smoking Status Current Every Day Smoker  Smokeless Tobacco Never Used    ROS: As per HPI.   OBJECTIVE:  Vitals:   11/05/17 1118  BP: (!) 175/142  Pulse: 75  Resp: 18  Temp: 98.4 F (36.9 C)  SpO2: 99%     General appearance: alert; appears fatigued HEENT: nasal congestion; clear runny nose; throat irritation secondary to post-nasal drainage Neck: supple without LAD Lungs: unlabored respirations, symmetrical air entry; cough: absent; no respiratory distress Skin: warm and dry; very dry skin of upper back Psychological: alert and cooperative; normal mood and affect   No Known Allergies  Social History   Socioeconomic History  .  Marital status: Single    Spouse name: Not on file  . Number of children: Not on file  . Years of education: Not on file  . Highest education level: Not on file  Social Needs  . Financial resource strain: Not on file  . Food insecurity - worry: Not on file  . Food insecurity - inability: Not on file  . Transportation needs - medical: Not on file  . Transportation needs - non-medical: Not on file  Occupational History  . Not on file  Tobacco Use  . Smoking status: Current Every Day Smoker  . Smokeless tobacco: Never Used  Substance and Sexual Activity  . Alcohol use: Yes  . Drug use: No  . Sexual activity: Not on file  Other Topics Concern  . Not on file  Social History Narrative  . Not on file           Mardella LaymanHagler, Vaani Morren, MD 11/05/17 1136

## 2017-11-05 NOTE — ED Triage Notes (Signed)
Pt here for sinus congestion and drainage x 2 weeks. reports that he has been taking BC powder, sinus mediatation and dayquil.

## 2018-01-15 ENCOUNTER — Ambulatory Visit (HOSPITAL_COMMUNITY)
Admission: EM | Admit: 2018-01-15 | Discharge: 2018-01-15 | Disposition: A | Discharge status: Home / Self Care | Payer: BLUE CROSS/BLUE SHIELD | Attending: Family Medicine | Admitting: Family Medicine

## 2018-01-15 ENCOUNTER — Encounter (HOSPITAL_COMMUNITY): Payer: Self-pay | Admitting: Family Medicine

## 2018-01-15 DIAGNOSIS — M79672 Pain in left foot: Secondary | ICD-10-CM

## 2018-01-15 DIAGNOSIS — K0889 Other specified disorders of teeth and supporting structures: Secondary | ICD-10-CM

## 2018-01-15 MED ORDER — AMOXICILLIN 875 MG PO TABS: 875.0000 mg | ORAL_TABLET | Freq: Two times a day (BID) | ORAL | 0 refills | Status: AC

## 2018-01-15 MED ORDER — INDOMETHACIN 50 MG PO CAPS: 50.0000 mg | ORAL_CAPSULE | Freq: Three times a day (TID) | ORAL | 0 refills | Status: DC | PRN

## 2018-01-15 MED ORDER — TRIAMCINOLONE ACETONIDE 0.1 % EX CREA: 1.0000 "application " | TOPICAL_CREAM | Freq: Two times a day (BID) | CUTANEOUS | 1 refills | Status: DC

## 2018-01-15 NOTE — ED Provider Notes (Signed)
[facility]   [id number] 01/15/18 Arrival Time: 1191  ASSESSMENT & PLAN:  1. Foot pain, left   2. Pain, dental    L great toe pain possibly gout. Discussed.  Meds ordered this encounter  Medications  . indomethacin (INDOCIN) 50 MG capsule    Sig: Take 1 capsule (50 mg total) by mouth 3 (three) times daily as needed.    Dispense:  15 capsule    Refill:  0  . amoxicillin (AMOXIL) 875 MG tablet    Sig: Take 1 tablet (875 mg total) by mouth 2 (two) times daily for 10 days.    Dispense:  20 tablet    Refill:  0  . triamcinolone cream (KENALOG) 0.1 %    Sig: Apply 1 application topically 2 (two) times daily.    Dispense:  45 g    Refill:  1   He has a dentist and has f/u soon for tooth issues. May f/u here for his foot pain if not seeing significant improvement over the next 2-3 days.  Requests refill of kenalog cream for eczema.  Reviewed expectations re: course of current medical issues. Questions answered. Outlined signs and symptoms indicating need for more acute intervention. Patient verbalized understanding. After Visit Summary given.  SUBJECTIVE: History from: patient. Victor Harvey is a 42 y.o. male who reports persistent mild to moderate pain of his left foot, specifically around proximal great toe, that is stable; described as aching without radiation. Onset: gradual, over the past 3 days. Injury/trama: no, but he does run approx 2 miles every morning; no trauma reported. Relieved by: rest. Worsened by: certain movements; weight bearing. Associated symptoms: none reported. Extremity sensation changes or weakness: none. Self treatment: Tylenol without much help. History of similar: no  Also reports L lower tooth/gum discomfort for the past several days. Gradually worsening. Much more tender today. Questions infection. No areas of drainage reported. No neck swelling or pain.  ROS: As per HPI.   OBJECTIVE:  Vitals:   01/15/18 1013  BP: (!)  182/124  Pulse: 74  Resp: 18  Temp: 98.2 F (36.8 C)  SpO2: 99%    General appearance: alert; no distress Oropharynx: erythema over L lower gum line around rear molars; tender to touch; no areas of fluctuance appreciated Extremities: warm and well perfused; symmetrical with no gross deformities; localized tenderness over his left great toe with mild swelling and no bruising; ROM: normal; no tenderness over metatarsals of L foot CV: normal extremity capillary refill Skin: warm and dry Neurologic: normal gait Psychological: alert and cooperative; normal mood and affect  No Known Allergies  Social History   Socioeconomic History  . Marital status: Single    Spouse name: Not on file  . Number of children: Not on file  . Years of education: Not on file  . Highest education level: Not on file  Occupational History  . Not on file  Social Needs  . Financial resource strain: Not on file  . Food insecurity:    Worry: Not on file    Inability: Not on file  . Transportation needs:    Medical: Not on file    Non-medical: Not on file  Tobacco Use  . Smoking status: Current Every Day Smoker  . Smokeless tobacco: Never Used  Substance and Sexual Activity  . Alcohol use: Yes  . Drug use: No  . Sexual activity: Not on file  Lifestyle  . Physical activity:    Days per week: Not  on file    Minutes per session: Not on file  . Stress: Not on file  Relationships  . Social connections:    Talks on phone: Not on file    Gets together: Not on file    Attends religious service: Not on file    Active member of club or organization: Not on file    Attends meetings of clubs or organizations: Not on file    Relationship status: Not on file  . Intimate partner violence:    Fear of current or ex partner: Not on file    Emotionally abused: Not on file    Physically abused: Not on file    Forced sexual activity: Not on file  Other Topics Concern  . Not on file  Social History Narrative  .  Not on file   History reviewed. No pertinent surgical history.    Mardella Layman, MD 01/15/18 1120

## 2018-01-15 NOTE — ED Triage Notes (Signed)
Pt here for left foot pain x 3 days. He reports that he runs every morning. There is redness and tenderness to the top medial part of foot by the great toe. He also needs abx for dental infection.

## 2018-08-08 ENCOUNTER — Ambulatory Visit (HOSPITAL_COMMUNITY)
Admission: EM | Admit: 2018-08-08 | Discharge: 2018-08-08 | Disposition: A | Discharge status: Home / Self Care | Payer: BLUE CROSS/BLUE SHIELD | Attending: Family Medicine | Admitting: Family Medicine

## 2018-08-08 ENCOUNTER — Encounter (HOSPITAL_COMMUNITY): Payer: Self-pay

## 2018-08-08 DIAGNOSIS — I10 Essential (primary) hypertension: Secondary | ICD-10-CM

## 2018-08-08 DIAGNOSIS — J069 Acute upper respiratory infection, unspecified: Secondary | ICD-10-CM

## 2018-08-08 DIAGNOSIS — B9789 Other viral agents as the cause of diseases classified elsewhere: Secondary | ICD-10-CM

## 2018-08-08 MED ORDER — TRIAMCINOLONE ACETONIDE 0.1 % EX CREA: 1.0000 "application " | TOPICAL_CREAM | Freq: Two times a day (BID) | CUTANEOUS | 0 refills | Status: DC

## 2018-08-08 MED ORDER — FLUTICASONE PROPIONATE 50 MCG/ACT NA SUSP: 2.0000 | Freq: Every day | NASAL | 0 refills | Status: DC

## 2018-08-08 MED ORDER — IPRATROPIUM BROMIDE 0.06 % NA SOLN: 2.0000 | Freq: Four times a day (QID) | NASAL | 0 refills | Status: AC

## 2018-08-08 MED ORDER — PREDNISONE 50 MG PO TABS: 50.0000 mg | ORAL_TABLET | Freq: Every day | ORAL | 0 refills | Status: DC

## 2018-08-08 NOTE — ED Triage Notes (Signed)
Pt presents with upper respiratory cold symptoms; congestion, nasal drainage, and cough.

## 2018-08-08 NOTE — ED Notes (Signed)
When I checked the blood pressure a second time on right arm it was 203/148

## 2018-08-08 NOTE — Discharge Instructions (Addendum)
Prednisone as directed. Start flonase, atrovent nasal spray for nasal congestion/drainage. You can use over the counter nasal saline rinse such as neti pot for nasal congestion. Keep hydrated, your urine should be clear to pale yellow in color. Tylenol/motrin for fever and pain. Monitor for any worsening of symptoms, chest pain, shortness of breath, wheezing, swelling of the throat, follow up for reevaluation.   As discussed, your blood pressure was 197/125 today. As we are not starting medicine today, please check your blood pressure at least once a week. Sit and rest for 5 mins before you check your blood pressure, preferably at the same time during the day. Document the blood pressure and follow up with PCP for further evaluation and management needed. Please decrease salt intake. Keep hydrated, your urine should be clear to pale yellow in color. If experiencing continued high blood pressure reading, with symptoms such as chest pain, shortness of breath, headache/blurry vision, one sided weakness, dizziness go to the emergency department for further evaluation needed.

## 2018-08-08 NOTE — ED Provider Notes (Signed)
[facility]    CSN: [id number] Arrival date & time: 08/08/18  1004     History   Chief Complaint Chief Complaint  Patient presents with  . URI    HPI Victor Harvey is a 42 y.o. male.   42 year old male comes in for 5-day history of URI symptoms.  Has had sore throat, productive cough, nasal congestion.  No obvious fever, chills, night sweats.  OTC cold medication without relief.  Never smoker.  No sick contact.  Patient was found to be hypertensive at triage.  No diagnosis of hypertension.  Denies chest pain, shortness of breath, wheezing, palpitations, leg swelling.  Denies headache/blurry vision, dizziness, weakness, syncope.     History reviewed. No pertinent past medical history.  There are no active problems to display for this patient.   History reviewed. No pertinent surgical history.     Home Medications    Prior to Admission medications   Medication Sig Start Date End Date Taking? Authorizing Provider  fluticasone (FLONASE) 50 MCG/ACT nasal spray Place 2 sprays into both nostrils daily. 08/08/18   Cathie Hoops, Yoshimi Sarr V, PA-C  ibuprofen (ADVIL,MOTRIN) 800 MG tablet Take 1 tablet (800 mg total) by mouth 3 (three) times daily. 03/17/17   Deatra Canter, FNP  indomethacin (INDOCIN) 50 MG capsule Take 1 capsule (50 mg total) by mouth 3 (three) times daily as needed. 01/15/18   Mardella Layman, MD  ipratropium (ATROVENT) 0.06 % nasal spray Place 2 sprays into both nostrils 4 (four) times daily. 08/08/18   Cathie Hoops, Niquan Charnley V, PA-C  predniSONE (DELTASONE) 50 MG tablet Take 1 tablet (50 mg total) by mouth daily. 08/08/18   Cathie Hoops, Cleotha Whalin V, PA-C  triamcinolone cream (KENALOG) 0.1 % Apply 1 application topically 2 (two) times daily. 08/08/18   Belinda Fisher, PA-C    Family History History reviewed. No pertinent family history.  Social History Social History   Tobacco Use  . Smoking status: Never Smoker  . Smokeless tobacco: Never Used  Substance Use Topics  . Alcohol use: Yes  .  Drug use: No     Allergies   Patient has no known allergies.   Review of Systems Review of Systems  Reason unable to perform ROS: See HPI as above.     Physical Exam Triage Vital Signs ED Triage Vitals  Enc Vitals Group     BP 08/08/18 1041 (!) 197/125     Pulse Rate 08/08/18 1041 84     Resp 08/08/18 1041 20     Temp 08/08/18 1041 98.3 F (36.8 C)     Temp Source 08/08/18 1041 Oral     SpO2 08/08/18 1041 99 %     Weight --      Height --      Head Circumference --      Peak Flow --      Pain Score 08/08/18 1043 2     Pain Loc --      Pain Edu? --      Excl. in GC? --    No data found.  Updated Vital Signs BP (!) 197/125 (BP Location: Left Arm)   Pulse 84   Temp 98.3 F (36.8 C) (Oral)   Resp 20   SpO2 99%   Physical Exam  Constitutional: He is oriented to person, place, and time. He appears well-developed and well-nourished. No distress.  HENT:  Head: Normocephalic and atraumatic.  Right Ear: Tympanic membrane, external ear and ear canal normal. Tympanic membrane  is not erythematous and not bulging.  Left Ear: Tympanic membrane, external ear and ear canal normal. Tympanic membrane is not erythematous and not bulging.  Nose: Mucosal edema and rhinorrhea present. Right sinus exhibits no maxillary sinus tenderness and no frontal sinus tenderness. Left sinus exhibits no maxillary sinus tenderness and no frontal sinus tenderness.  Mouth/Throat: Uvula is midline, oropharynx is clear and moist and mucous membranes are normal.  Eyes: Pupils are equal, round, and reactive to light. Conjunctivae are normal.  Neck: Normal range of motion. Neck supple.  Cardiovascular: Normal rate, regular rhythm and normal heart sounds. Exam reveals no gallop and no friction rub.  No murmur heard. Pulmonary/Chest: Effort normal and breath sounds normal. He has no decreased breath sounds. He has no wheezes. He has no rhonchi. He has no rales.  Lymphadenopathy:    He has no cervical  adenopathy.  Neurological: He is alert and oriented to person, place, and time.  Skin: Skin is warm and dry.  Psychiatric: He has a normal mood and affect. His behavior is normal. Judgment normal.     UC Treatments / Results  Labs (all labs ordered are listed, but only abnormal results are displayed) Labs Reviewed - No data to display  EKG None  Radiology No results found.  Procedures Procedures (including critical care time)  Medications Ordered in UC Medications - No data to display  Initial Impression / Assessment and Plan / UC Course  I have reviewed the triage vital signs and the nursing notes.  Pertinent labs & imaging results that were available during my care of the patient were reviewed by me and considered in my medical decision making (see chart for details).    Discussed with patient history and exam most consistent with viral URI. Symptomatic treatment as needed. Push fluids. Return precautions given.   Discussed starting blood pressure medication given patient with several visits of elevated blood pressure.  Patient would like to defer medications for now.  He is asymptomatic.  Will have patient start DASH diet, and follow-up with PCP for evaluation of hypertension.  Strict return precautions given.  Patient expresses understanding and agrees to plan.  Final Clinical Impressions(s) / UC Diagnoses   Final diagnoses:  Viral URI with cough  Hypertension, unspecified type    ED Prescriptions    Medication Sig Dispense Auth. Provider   triamcinolone cream (KENALOG) 0.1 % Apply 1 application topically 2 (two) times daily. 80 g Semiah Konczal V, PA-C   predniSONE (DELTASONE) 50 MG tablet Take 1 tablet (50 mg total) by mouth daily. 5 tablet Iliana Hutt V, PA-C   ipratropium (ATROVENT) 0.06 % nasal spray Place 2 sprays into both nostrils 4 (four) times daily. 15 mL Becky Colan V, PA-C   fluticasone (FLONASE) 50 MCG/ACT nasal spray Place 2 sprays into both nostrils daily. 1 g Threasa AlphaYu,  Nykerria Macconnell V, PA-C        Lawana Hartzell V, [place] 08/08/18 1402

## 2018-10-08 ENCOUNTER — Other Ambulatory Visit: Payer: Self-pay

## 2018-10-08 ENCOUNTER — Ambulatory Visit (HOSPITAL_COMMUNITY)
Admission: EM | Admit: 2018-10-08 | Discharge: 2018-10-08 | Disposition: A | Discharge status: Home / Self Care | Payer: BLUE CROSS/BLUE SHIELD | Attending: Internal Medicine | Admitting: Internal Medicine

## 2018-10-08 ENCOUNTER — Encounter (HOSPITAL_COMMUNITY): Payer: Self-pay | Admitting: Emergency Medicine

## 2018-10-08 DIAGNOSIS — J309 Allergic rhinitis, unspecified: Secondary | ICD-10-CM | POA: Diagnosis not present

## 2018-10-08 DIAGNOSIS — H1013 Acute atopic conjunctivitis, bilateral: Secondary | ICD-10-CM | POA: Insufficient documentation

## 2018-10-08 DIAGNOSIS — R030 Elevated blood-pressure reading, without diagnosis of hypertension: Secondary | ICD-10-CM | POA: Insufficient documentation

## 2018-10-08 MED ORDER — OLOPATADINE HCL 0.1 % OP SOLN: 1.0000 [drp] | Freq: Two times a day (BID) | OPHTHALMIC | 12 refills | Status: DC

## 2018-10-08 NOTE — ED Provider Notes (Signed)
[facility]    CSN: [id number] Arrival date & time: 10/08/18  1130     History   Chief Complaint Chief Complaint  Patient presents with  . Eye Drainage    HPI Victor Harvey is a 43 y.o. male comes to the urgent care department on account of worsening reddness of his eyes with itching and watering of his eyes.  Itching of the eyes is of moderate severity.  No aggravating factors.  Patient denies any relieving factors.  No photophobia.  No nausea or vomiting.  No itching throat.  Patient was recently started on Flonase with good response regarding allergic rhinitis.  No rash.  Patient has purulent drainage in the morning.    History reviewed. No pertinent past medical history.  There are no active problems to display for this patient.   History reviewed. No pertinent surgical history.     Home Medications    Prior to Admission medications   Medication Sig Start Date End Date Taking? Authorizing Provider  fluticasone (FLONASE) 50 MCG/ACT nasal spray Place 2 sprays into both nostrils daily. 08/08/18   Cathie Hoops, Amy V, PA-C  ibuprofen (ADVIL,MOTRIN) 800 MG tablet Take 1 tablet (800 mg total) by mouth 3 (three) times daily. 03/17/17   Deatra Canter, FNP  indomethacin (INDOCIN) 50 MG capsule Take 1 capsule (50 mg total) by mouth 3 (three) times daily as needed. 01/15/18   Mardella Layman, MD  ipratropium (ATROVENT) 0.06 % nasal spray Place 2 sprays into both nostrils 4 (four) times daily. 08/08/18   Cathie Hoops, Amy V, PA-C  olopatadine (PATANOL) 0.1 % ophthalmic solution Place 1 drop into both eyes 2 (two) times daily. 10/08/18   Lamptey, Britta Mccreedy, MD  predniSONE (DELTASONE) 50 MG tablet Take 1 tablet (50 mg total) by mouth daily. 08/08/18   Cathie Hoops, Amy V, PA-C  triamcinolone cream (KENALOG) 0.1 % Apply 1 application topically 2 (two) times daily. 08/08/18   Belinda Fisher, PA-C    Family History No family history on file.  Social History Social History   Tobacco Use  . Smoking status:  Never Smoker  . Smokeless tobacco: Never Used  Substance Use Topics  . Alcohol use: Yes  . Drug use: No     Allergies   Patient has no known allergies.   Review of Systems Review of Systems  Constitutional: Negative.   Respiratory: Negative for cough, choking and chest tightness.   Cardiovascular: Negative for chest pain, palpitations and leg swelling.  Gastrointestinal: Negative for abdominal distention, abdominal pain and anal bleeding.  Endocrine: Negative for heat intolerance, polydipsia and polyphagia.  Neurological: Negative for dizziness, light-headedness, numbness and headaches.     Physical Exam Triage Vital Signs ED Triage Vitals [10/08/18 1234]  Enc Vitals Group     BP (!) 179/119     Pulse Rate 87     Resp 16     Temp 97.9 F (36.6 C)     Temp Source Oral     SpO2 97 %     Weight      Height      Head Circumference      Peak Flow      Pain Score 2     Pain Loc      Pain Edu?      Excl. in GC?    No data found.  Updated Vital Signs BP (!) 179/119   Pulse 87   Temp 97.9 F (36.6 C) (Oral)   Resp  16   SpO2 97%   Visual Acuity Right Eye Distance:   Left Eye Distance:   Bilateral Distance:    Right Eye Near:   Left Eye Near:    Bilateral Near:     Physical Exam Constitutional:      Appearance: Normal appearance.  HENT:     Head: Normocephalic.     Right Ear: Tympanic membrane normal.     Left Ear: Tympanic membrane normal.     Nose: Congestion and rhinorrhea present.     Mouth/Throat:     Mouth: Mucous membranes are moist.     Pharynx: No oropharyngeal exudate.  Eyes:     General: No scleral icterus.       Right eye: Discharge present.        Left eye: Discharge present. Neck:     Musculoskeletal: Normal range of motion and neck supple. No muscular tenderness.  Cardiovascular:     Rate and Rhythm: Normal rate and regular rhythm.     Pulses: Normal pulses.  Pulmonary:     Effort: Pulmonary effort is normal.     Breath sounds:  Normal breath sounds.  Neurological:     Mental Status: He is alert.      UC Treatments / Results  Labs (all labs ordered are listed, but only abnormal results are displayed) Labs Reviewed - No data to display  EKG None  Radiology No results found.  Procedures Procedures (including critical care time)  Medications Ordered in UC Medications - No data to display  Initial Impression / Assessment and Plan / UC Course  I have reviewed the triage vital signs and the nursing notes.  Pertinent labs & imaging results that were available during my care of the patient were reviewed by me and considered in my medical decision making (see chart for details).     Allergic conjunctivitis: Olopatadine optic solution Continue Flonase, Zyrtec Follow-up with PCP  Elevated blood pressure without diagnosis of hypertension: I had an extensive discussion with the patient regarding elevated blood pressure and the need for antihypertensive agents.  Patient does not want to take antihypertensive agents this time around.  He will prefer to think about it and report back on his decision.  Patient was counseled about the dangers of elevated blood pressure with no treatment   Final Clinical Impressions(s) / UC Diagnoses   Final diagnoses:  Allergic conjunctivitis of both eyes and rhinitis   Discharge Instructions   None    ED Prescriptions    Medication Sig Dispense Auth. Provider   olopatadine (PATANOL) 0.1 % ophthalmic solution Place 1 drop into both eyes 2 (two) times daily. 5 mL Lamptey, Britta MccreedyPhilip O, MD     Controlled Substance Prescriptions [place] Controlled Substance Registry consulted? No   Merrilee JanskyLamptey, Philip O, MD 10/08/18 [phone]

## 2018-10-08 NOTE — ED Triage Notes (Signed)
PT reports bilateral eye drainage and tearing for 1 week.   PT is hypertensive. PT reports he was hypertensive last time he was seen and refused BP meds at last visit

## 2018-11-12 ENCOUNTER — Ambulatory Visit (HOSPITAL_COMMUNITY)
Admission: EM | Admit: 2018-11-12 | Discharge: 2018-11-12 | Disposition: A | Discharge status: Home / Self Care | Payer: BLUE CROSS/BLUE SHIELD | Attending: Family Medicine | Admitting: Family Medicine

## 2018-11-12 ENCOUNTER — Encounter (HOSPITAL_COMMUNITY): Payer: Self-pay | Admitting: Emergency Medicine

## 2018-11-12 DIAGNOSIS — H1033 Unspecified acute conjunctivitis, bilateral: Secondary | ICD-10-CM

## 2018-11-12 DIAGNOSIS — J019 Acute sinusitis, unspecified: Secondary | ICD-10-CM

## 2018-11-12 MED ORDER — AMOXICILLIN-POT CLAVULANATE 875-125 MG PO TABS: 1.0000 | ORAL_TABLET | Freq: Two times a day (BID) | ORAL | 0 refills | Status: AC

## 2018-11-12 MED ORDER — POLYMYXIN B-TRIMETHOPRIM 10000-0.1 UNIT/ML-% OP SOLN: 1.0000 [drp] | OPHTHALMIC | 0 refills | Status: DC

## 2018-11-12 NOTE — ED Triage Notes (Signed)
PT C/O: bilateral eye drainage associated w/itchiness onset 6 weeks +++   Seen here on 10/08/2018   Given Patanol drops w/no relief.   Denies inj/trauma  DENIES: f/vn/d  TAKING MEDS:   A&O x4... NAD... Ambulatory

## 2018-11-12 NOTE — Discharge Instructions (Signed)
Please begin Augmentin twice daily for the next week Use Polytrim every 4 hours in both eyes for the next week as well Please avoid rubbing eyes, have good hand hygiene Continue Zyrtec/Claritin, may supplement with Mucinex or Flonase to further help with congestion Use over-the-counter Delsym or Robitussin-DM or Mucinex DM for cough  Please follow-up if symptoms persisting, worsening, developing vision changes, eye pain or swelling around eyes.

## 2018-11-12 NOTE — ED Provider Notes (Signed)
[facility]    CSN: [id number] Arrival date & time: 11/12/18  1441     History   Chief Complaint Chief Complaint  Patient presents with  . Eye Problem    HPI Victor Harvey is a 43 y.o. male no significant past medical history today for evaluation of eye drainage and sinus symptoms.  Patient states that for the past 6 weeks he has had persistent drainage from his eyes with associated irritation and mild itching.  He also notes that he has had associated nasal congestion and cough.  Denies sore throat.  Denies any fevers.  Patient was seen here at the end of January approximately 1 month ago for similar issues with his eyes and was treated for allergic conjunctivitis with olopatadine.  He has not had improvement of symptoms since then.  He denies any vision changes, except for occasional blurring due to drainage.  Denies contact use.  Denies eye pain.  He has been taking daily Claritin/Zyrtec without relief.  HPI  History reviewed. No pertinent past medical history.  There are no active problems to display for this patient.   History reviewed. No pertinent surgical history.     Home Medications    Prior to Admission medications   Medication Sig Start Date End Date Taking? Authorizing Provider  amoxicillin-clavulanate (AUGMENTIN) 875-125 MG tablet Take 1 tablet by mouth every 12 (twelve) hours for 7 days. 11/12/18 11/19/18  ,  C, PA-C  fluticasone (FLONASE) 50 MCG/ACT nasal spray Place 2 sprays into both nostrils daily. 08/08/18   Cathie HoopsYu, Amy V, PA-C  ibuprofen (ADVIL,MOTRIN) 800 MG tablet Take 1 tablet (800 mg total) by mouth 3 (three) times daily. 03/17/17   Deatra Canterxford, William J, FNP  indomethacin (INDOCIN) 50 MG capsule Take 1 capsule (50 mg total) by mouth 3 (three) times daily as needed. 01/15/18   Mardella LaymanHagler, Brian, MD  ipratropium (ATROVENT) 0.06 % nasal spray Place 2 sprays into both nostrils 4 (four) times daily. 08/08/18   Cathie HoopsYu, Amy V, PA-C  olopatadine (PATANOL)  0.1 % ophthalmic solution Place 1 drop into both eyes 2 (two) times daily. 10/08/18   Lamptey, Britta MccreedyPhilip O, MD  predniSONE (DELTASONE) 50 MG tablet Take 1 tablet (50 mg total) by mouth daily. 08/08/18   Cathie HoopsYu, Amy V, PA-C  triamcinolone cream (KENALOG) 0.1 % Apply 1 application topically 2 (two) times daily. 08/08/18   Cathie HoopsYu, Amy V, PA-C  trimethoprim-polymyxin b (POLYTRIM) ophthalmic solution Place 1 drop into both eyes every 4 (four) hours. 11/12/18   , Junius Creamer C, PA-C    Family History History reviewed. No pertinent family history.  Social History Social History   Tobacco Use  . Smoking status: Never Smoker  . Smokeless tobacco: Never Used  Substance Use Topics  . Alcohol use: Yes  . Drug use: No     Allergies   Patient has no known allergies.   Review of Systems Review of Systems  Constitutional: Negative for activity change, appetite change, chills, fatigue and fever.  HENT: Positive for congestion, rhinorrhea and sinus pressure. Negative for ear pain, sore throat and trouble swallowing.   Eyes: Positive for discharge, redness and itching. Negative for photophobia and visual disturbance.  Respiratory: Positive for cough. Negative for chest tightness and shortness of breath.   Cardiovascular: Negative for chest pain.  Gastrointestinal: Negative for abdominal pain, diarrhea, nausea and vomiting.  Musculoskeletal: Negative for myalgias.  Skin: Negative for rash.  Neurological: Negative for dizziness, light-headedness and headaches.  Physical Exam Triage Vital Signs ED Triage Vitals  Enc Vitals Group     BP 11/12/18 1541 (!) 179/11     Pulse Rate 11/12/18 1541 88     Resp 11/12/18 1541 20     Temp 11/12/18 1541 98.9 F (37.2 C)     Temp Source 11/12/18 1541 Oral     SpO2 11/12/18 1541 98 %     Weight --      Height --      Head Circumference --      Peak Flow --      Pain Score 11/12/18 1544 0     Pain Loc --      Pain Edu? --      Excl. in GC? --    No data  found.  Updated Vital Signs BP (!) 180/99 (BP Location: Right Arm)   Pulse 88   Temp 98.9 F (37.2 C) (Oral)   Resp 20   SpO2 98%   Visual Acuity Right Eye Distance:  20/20 Left Eye Distance:  20/25 Bilateral Distance:  20/15  Right Eye Near:   Left Eye Near:    Bilateral Near:     Physical Exam Vitals signs and nursing note reviewed.  Constitutional:      Appearance: He is well-developed.  HENT:     Head: Normocephalic and atraumatic.     Ears:     Comments: Bilateral ears without tenderness to palpation of external auricle, tragus and mastoid, EAC's without erythema or swelling, TM's with good bony landmarks and cone of light. Non erythematous.    Nose:     Comments: Turbinates swollen and erythematous    Mouth/Throat:     Comments: Oral mucosa pink and moist, no tonsillar enlargement or exudate. Posterior pharynx patent and nonerythematous, no uvula deviation or swelling. Normal phonation. Eyes:     Extraocular Movements: Extraocular movements intact.     Conjunctiva/sclera: Conjunctivae normal.     Pupils: Pupils are equal, round, and reactive to light.     Comments: Bilateral conjunctive a mildly erythematous, slightly thickened drainage present in bilateral medial canthus  Neck:     Musculoskeletal: Neck supple.  Cardiovascular:     Rate and Rhythm: Normal rate and regular rhythm.     Heart sounds: No murmur.  Pulmonary:     Effort: Pulmonary effort is normal. No respiratory distress.     Breath sounds: Normal breath sounds.     Comments: Breathing comfortably at rest, CTABL, no wheezing, rales or other adventitious sounds auscultated Abdominal:     Palpations: Abdomen is soft.     Tenderness: There is no abdominal tenderness.  Skin:    General: Skin is warm and dry.  Neurological:     Mental Status: He is alert.      UC Treatments / Results  Labs (all labs ordered are listed, but only abnormal results are displayed) Labs Reviewed - No data to  display  EKG None  Radiology No results found.  Procedures Procedures (including critical care time)  Medications Ordered in UC Medications - No data to display  Initial Impression / Assessment and Plan / UC Course  I have reviewed the triage vital signs and the nursing notes.  Pertinent labs & imaging results that were available during my care of the patient were reviewed by me and considered in my medical decision making (see chart for details).     Given length of symptoms we will treat patient for sinusitis as well  as bacterial conjunctivitis with Augmentin and Polytrim.  No changes in visual acuity.  Continue daily antihistamine, may supplement with Mucinex or Flonase for further control of congestion.  Delsym and Robitussin over-the-counter as needed for cough.  Continue to monitor,Discussed strict return precautions. Patient verbalized understanding and is agreeable with plan.  Final Clinical Impressions(s) / UC Diagnoses   Final diagnoses:  Acute bacterial conjunctivitis of both eyes  Acute sinusitis with symptoms > 10 days     Discharge Instructions     Please begin Augmentin twice daily for the next week Use Polytrim every 4 hours in both eyes for the next week as well Please avoid rubbing eyes, have good hand hygiene Continue Zyrtec/Claritin, may supplement with Mucinex or Flonase to further help with congestion Use over-the-counter Delsym or Robitussin-DM or Mucinex DM for cough  Please follow-up if symptoms persisting, worsening, developing vision changes, eye pain or swelling around eyes.   ED Prescriptions    Medication Sig Dispense Auth. Provider   trimethoprim-polymyxin b (POLYTRIM) ophthalmic solution Place 1 drop into both eyes every 4 (four) hours. 10 mL ,  C, PA-C   amoxicillin-clavulanate (AUGMENTIN) 875-125 MG tablet Take 1 tablet by mouth every 12 (twelve) hours for 7 days. 14 tablet , [place] C, PA-C     Controlled Substance  Prescriptions [place] Controlled Substance Registry consulted? Not Applicable   Lew Dawes, [place] 11/12/18 1614

## 2019-02-01 ENCOUNTER — Other Ambulatory Visit: Payer: Self-pay

## 2019-02-01 ENCOUNTER — Ambulatory Visit (HOSPITAL_COMMUNITY)
Admission: EM | Admit: 2019-02-01 | Discharge: 2019-02-01 | Disposition: A | Discharge status: Home / Self Care | Payer: BLUE CROSS/BLUE SHIELD | Attending: Family Medicine | Admitting: Family Medicine

## 2019-02-01 ENCOUNTER — Ambulatory Visit (INDEPENDENT_AMBULATORY_CARE_PROVIDER_SITE_OTHER): Payer: BLUE CROSS/BLUE SHIELD

## 2019-02-01 ENCOUNTER — Encounter (HOSPITAL_COMMUNITY): Payer: Self-pay

## 2019-02-01 DIAGNOSIS — S46309A Unspecified injury of muscle, fascia and tendon of triceps, unspecified arm, initial encounter: Secondary | ICD-10-CM

## 2019-02-01 DIAGNOSIS — H6690 Otitis media, unspecified, unspecified ear: Secondary | ICD-10-CM

## 2019-02-01 DIAGNOSIS — M79601 Pain in right arm: Secondary | ICD-10-CM

## 2019-02-01 MED ORDER — CLARITHROMYCIN 500 MG PO TABS: 500.0000 mg | ORAL_TABLET | Freq: Two times a day (BID) | ORAL | 0 refills | Status: DC

## 2019-02-01 MED ORDER — FLUTICASONE PROPIONATE 50 MCG/ACT NA SUSP: 2.0000 | Freq: Every day | NASAL | 0 refills | Status: DC

## 2019-02-01 MED ORDER — HYDROCODONE-ACETAMINOPHEN 7.5-325 MG PO TABS: 1.0000 | ORAL_TABLET | Freq: Four times a day (QID) | ORAL | 0 refills | Status: DC | PRN

## 2019-02-01 MED ORDER — IBUPROFEN 800 MG PO TABS: 800.0000 mg | ORAL_TABLET | Freq: Three times a day (TID) | ORAL | 0 refills | Status: DC

## 2019-02-01 NOTE — ED Triage Notes (Signed)
Patient presents to Urgent Care with complaints of bilateral ear pain, thinks he has an infection since about amonth ago. Patient reports he has been putting peroxide and baby oil in it but thinks he needs something else. Pt was also "rough housed" last night playing basketball and thinks he may have torn ligaments in his right upper arm, obvious deformity noted, pt not able to fully extend right arm, denies numbness or tingling.

## 2019-02-01 NOTE — Discharge Instructions (Signed)
For your ear infection, take the antibiotic 2 times a day Take with food Use the Flonase for a couple weeks to your ears feel better Consider follow-up with your primary care doctor  For your arm you need to use ice for 20 minutes every couple of hours Rest the arm, leave it in the sling for good 2 to 3 days, possibly a week Take your arm out of the sling couple times a day and work on regaining movement If your arm does not show definite provement towards the end of the week then you need to see an orthopedic doctor Take ibuprofen 3 times a day with food. When pain is severe you may take hydrocodone.  Do not take hydrocodone and drive

## 2019-02-01 NOTE — ED Provider Notes (Signed)
[facility]    CSN: [id number] Arrival date & time: 02/01/19  [id number]     History   Chief Complaint Chief Complaint  Patient presents with  . Otitis Media    HPI Rosalie GumsBradford N Siers is a 43 y.o. male.   HPI Patient is here for 2 different problems.  First he has had ear pressure and pain for about a month.  He is tried some eardrops.  In spite of this his ear still hurt.  They feel like there is pressure.  He feels like he has decreased hearing.  No runny or stuffy nose.  No sinus congestion.  No postnasal drip.  No allergies.  No prior history of ear problems.  No drainage from the ears He also has problems with his right arm.  He states he was "roughed up" while he was playing basketball last night.  He was thrown up against a post.  He had his arm forcibly.  He has pain with any movement of the arm.  No numbness or tingling in the distal hand or arm.  The swelling is localized to the upper arm.  The pain is mostly in the back of his arm in the triceps area. History reviewed. No pertinent past medical history.  There are no active problems to display for this patient.   History reviewed. No pertinent surgical history.     Home Medications    Prior to Admission medications   Medication Sig Start Date End Date Taking? Authorizing Provider  clarithromycin (BIAXIN) 500 MG tablet Take 1 tablet (500 mg total) by mouth 2 (two) times daily. 02/01/19   Eustace MooreNelson, Blessed Cotham Sue, MD  fluticasone [facility](FLONASE) 50 MCG/ACT nasal spray Place 2 sprays into both nostrils daily. 02/01/19   Eustace MooreNelson, Starlett Pehrson Sue, MD  HYDROcodone-acetaminophen (NORCO) 7.5-325 MG tablet Take 1 tablet by mouth every 6 (six) hours as needed for moderate pain. 02/01/19   Eustace MooreNelson, Kaslyn Richburg Sue, MD  ibuprofen (ADVIL) 800 MG tablet Take 1 tablet (800 mg total) by mouth 3 (three) times daily. 02/01/19   Eustace MooreNelson, Elwood Bazinet Sue, MD  ipratropium (ATROVENT) 0.06 % nasal spray Place 2 sprays into both nostrils 4 (four) times daily. 08/08/18    Cathie HoopsYu, Amy V, PA-C  olopatadine (PATANOL) 0.1 % ophthalmic solution Place 1 drop into both eyes 2 (two) times daily. 10/08/18   Lamptey, Britta MccreedyPhilip O, MD    Family History History reviewed. No pertinent family history.  Social History Social History   Tobacco Use  . Smoking status: Never Smoker  . Smokeless tobacco: Never Used  Substance Use Topics  . Alcohol use: Yes  . Drug use: No     Allergies   Patient has no known allergies.   Review of Systems Review of Systems  Constitutional: Negative for chills and fever.  HENT: Positive for ear pain and hearing loss. Negative for congestion, ear discharge and sore throat.   Eyes: Negative for pain and visual disturbance.  Respiratory: Negative for cough and shortness of breath.   Cardiovascular: Negative for chest pain and palpitations.  Gastrointestinal: Negative for abdominal pain and vomiting.  Genitourinary: Negative for dysuria and hematuria.  Musculoskeletal: Positive for arthralgias. Negative for back pain.  Skin: Negative for color change and rash.  Neurological: Negative for seizures and syncope.  All other systems reviewed and are negative.    Physical Exam Triage Vital Signs ED Triage Vitals  Enc Vitals Group     BP 02/01/19 1012 (!) 173/138     Pulse  Rate 02/01/19 1012 (!) 109     Resp 02/01/19 1012 18     Temp 02/01/19 1012 98.6 F (37 C)     Temp Source 02/01/19 1012 Oral     SpO2 02/01/19 1012 100 %     Weight --      Height --      Head Circumference --      Peak Flow --      Pain Score 02/01/19 1009 7     Pain Loc --      Pain Edu? --      Excl. in GC? --    No data found.  Updated Vital Signs BP (!) 173/138 (BP Location: Left Arm)   Pulse (!) 109   Temp 98.6 F (37 C) (Oral)   Resp 18   SpO2 100%  :     Physical Exam Constitutional:      General: He is not in acute distress.    Appearance: He is well-developed.  HENT:     Head: Normocephalic and atraumatic.     Right Ear: Ear canal and  external ear normal.     Left Ear: Ear canal and external ear normal.     Ears:     Comments: Both TMs have a cobblestoned irregular vesicular appearance, dull    Nose: Nose normal. No congestion or rhinorrhea.     Mouth/Throat:     Mouth: Mucous membranes are moist.     Pharynx: Posterior oropharyngeal erythema present.  Eyes:     Conjunctiva/sclera: Conjunctivae normal.     Pupils: Pupils are equal, round, and reactive to light.  Neck:     Musculoskeletal: Normal range of motion.  Cardiovascular:     Rate and Rhythm: Normal rate.  Pulmonary:     Effort: Pulmonary effort is normal. No respiratory distress.  Abdominal:     General: There is no distension.     Palpations: Abdomen is soft.  Musculoskeletal: Normal range of motion.     Comments: Right upper extremity has swelling posteriorly along the triceps.  Tenderness along this area.  Patient can flex to 90 degrees and extension lacks 15 degrees.  Mild pain with rotation and pronation supination of forearm.  He has pain with shoulder abduction above 30 degrees.  Skin:    General: Skin is warm and dry.  Neurological:     Mental Status: He is alert.      UC Treatments / Results  Labs (all labs ordered are listed, but only abnormal results are displayed) Labs Reviewed - No data to display  EKG None  Radiology Dg Humerus Right  Result Date: 02/01/2019 CLINICAL DATA:  Pain and swelling after hitting metal pole wall playing basketball EXAM: RIGHT HUMERUS - 2+ VIEW COMPARISON:  None. FINDINGS: Frontal and lateral views were obtained. There is no evident fracture or dislocation. There is soft tissue swelling distally. No abnormal periosteal reaction. Joint spaces appear normal. IMPRESSION: Soft tissue swelling distally. No fracture or dislocation. No appreciable arthropathy. Electronically Signed   By: Bretta Bang III M.D.   On: 02/01/2019 10:43    Procedures Procedures (including critical care time)  Medications  Ordered in UC Medications - No data to display  Initial Impression / Assessment and Plan / UC Course  I have reviewed the triage vital signs and the nursing notes.  Pertinent labs & imaging results that were available during my care of the patient were reviewed by me and considered in my medical decision  making (see chart for details).    Patient has tenderness and pain in his tricep region with decreased range of motion of his shoulder and his elbow.  No abnormalities on x-ray.  This may just be a contusion with swelling.  Hopefully he does not have any musculoligamentous tears.  He is advised to follow-up with his PCP or orthopedist if he does not see improvement in a few days Final Clinical Impressions(s) / UC Diagnoses   Final diagnoses:  Pain in right arm  Acute otitis media, unspecified otitis media type  Injury of triceps     Discharge Instructions     For your ear infection, take the antibiotic 2 times a day Take with food Use the Flonase for a couple weeks to your ears feel better Consider follow-up with your primary care doctor  For your arm you need to use ice for 20 minutes every couple of hours Rest the arm, leave it in the sling for good 2 to 3 days, possibly a week Take your arm out of the sling couple times a day and work on regaining movement If your arm does not show definite provement towards the end of the week then you need to see an orthopedic doctor Take ibuprofen 3 times a day with food. When pain is severe you may take hydrocodone.  Do not take hydrocodone and drive   ED Prescriptions    Medication Sig Dispense Auth. Provider   fluticasone (FLONASE) 50 MCG/ACT nasal spray Place 2 sprays into both nostrils daily. 1 g Eustace Moore, MD   clarithromycin (BIAXIN) 500 MG tablet Take 1 tablet (500 mg total) by mouth 2 (two) times daily. 20 tablet Eustace Moore, MD   ibuprofen (ADVIL) 800 MG tablet Take 1 tablet (800 mg total) by mouth 3 (three) times  daily. 21 tablet Eustace Moore, MD   HYDROcodone-acetaminophen [facility]) 7.5-325 MG tablet Take 1 tablet by mouth every 6 (six) hours as needed for moderate pain. 15 tablet Eustace Moore, MD     Controlled Substance Prescriptions [place] Controlled Substance Registry consulted? Yes, I have consulted the [place] Controlled Substances Registry for this patient, and feel the risk/benefit ratio today is favorable for proceeding with this prescription for a controlled substance.   Eustace Moore, MD 02/01/19 1714

## 2019-05-21 ENCOUNTER — Other Ambulatory Visit: Payer: Self-pay

## 2019-05-21 DIAGNOSIS — Z20822 Contact with and (suspected) exposure to covid-19: Secondary | ICD-10-CM

## 2019-05-22 LAB — NOVEL CORONAVIRUS, NAA: SARS-CoV-2, NAA: NOT DETECTED

## 2019-11-24 ENCOUNTER — Encounter (HOSPITAL_COMMUNITY): Payer: Self-pay

## 2019-11-24 ENCOUNTER — Other Ambulatory Visit: Payer: Self-pay

## 2019-11-24 ENCOUNTER — Ambulatory Visit (HOSPITAL_COMMUNITY)
Admission: EM | Admit: 2019-11-24 | Discharge: 2019-11-24 | Disposition: A | Discharge status: Home / Self Care | Payer: Self-pay | Attending: Internal Medicine | Admitting: Internal Medicine

## 2019-11-24 DIAGNOSIS — J309 Allergic rhinitis, unspecified: Secondary | ICD-10-CM

## 2019-11-24 DIAGNOSIS — I160 Hypertensive urgency: Secondary | ICD-10-CM

## 2019-11-24 DIAGNOSIS — H1013 Acute atopic conjunctivitis, bilateral: Secondary | ICD-10-CM

## 2019-11-24 MED ORDER — PREDNISONE 10 MG PO TABS: 10.0000 mg | ORAL_TABLET | Freq: Every day | ORAL | 0 refills | Status: AC

## 2019-11-24 MED ORDER — OLOPATADINE HCL 0.1 % OP SOLN: 1.0000 [drp] | Freq: Two times a day (BID) | OPHTHALMIC | 12 refills | Status: DC

## 2019-11-24 MED ORDER — FLUTICASONE PROPIONATE 50 MCG/ACT NA SUSP: 2.0000 | Freq: Every day | NASAL | 2 refills | Status: AC

## 2019-11-24 MED ORDER — FLUTICASONE PROPIONATE 50 MCG/ACT NA SUSP: 2.0000 | Freq: Every day | NASAL | 0 refills | Status: DC

## 2019-11-24 MED ORDER — MONTELUKAST SODIUM 10 MG PO TABS: 10.0000 mg | ORAL_TABLET | Freq: Every day | ORAL | 1 refills | Status: DC

## 2019-11-24 MED ORDER — OLOPATADINE HCL 0.1 % OP SOLN: 1.0000 [drp] | Freq: Two times a day (BID) | OPHTHALMIC | 3 refills | Status: DC

## 2019-11-24 MED ORDER — MONTELUKAST SODIUM 10 MG PO TABS: 10.0000 mg | ORAL_TABLET | Freq: Every day | ORAL | 2 refills | Status: AC

## 2019-11-24 NOTE — ED Provider Notes (Signed)
[facility]    CSN: [id number] Arrival date & time: 11/24/19  1127      History   Chief Complaint Chief Complaint  Patient presents with  . seasonal allergies    HPI Victor Harvey is a 44 y.o. male with a history of seasonal allergies comes to urgent care with complaints of itchy eyes of 1 week duration.  Symptoms started insidiously and is gotten progressively worse.  No known relieving or aggravating factors.  Patient has a history of allergies which tend to flareup around this time of the year.  He has had increased tearing of his eye and a couple days ago started having itchy runny nose.  He denies any sore throat or itchy throat.  No shortness of breath or wheezing.  No rash.  No redness of his eyes.  Patient denies any headaches, chest pain, shortness of breath or abdominal pain.  I took care of patient about a year ago and he had hypertensive urgency.  Patient refused antihypertensive agents on account of family member who ended up suffering adverse effect from antihypertensive use.  Patient denies any numbness or tingling.  Today he continues to have elevated blood pressure and has not started taking any antihypertensives at this time.  He is not interested in starting antihypertensive medications at this time.[name]   HPI  History reviewed. No pertinent past medical history.  There are no problems to display for this patient.   History reviewed. No pertinent surgical history.     Home Medications    Prior to Admission medications   Medication Sig Start Date End Date Taking? Authorizing Provider  clarithromycin (BIAXIN) 500 MG tablet Take 1 tablet (500 mg total) by mouth 2 (two) times daily. 02/01/19   Eustace Moore, MD  fluticasone [facility]) 50 MCG/ACT nasal spray Place 2 sprays into both nostrils daily. 11/24/19   Lakiya Cottam, Britta Mccreedy, MD  HYDROcodone-acetaminophen (NORCO) 7.5-325 MG tablet Take 1 tablet by mouth every 6 (six) hours as needed for moderate  pain. 02/01/19   Eustace Moore, MD  ibuprofen (ADVIL) 800 MG tablet Take 1 tablet (800 mg total) by mouth 3 (three) times daily. 02/01/19   Eustace Moore, MD  ipratropium (ATROVENT) 0.06 % nasal spray Place 2 sprays into both nostrils 4 (four) times daily. 08/08/18   Cathie Hoops, Amy V, PA-C  montelukast (SINGULAIR) 10 MG tablet Take 1 tablet (10 mg total) by mouth at bedtime. 11/24/19   Moet Mikulski, Britta Mccreedy, MD  olopatadine (PATANOL) 0.1 % ophthalmic solution Place 1 drop into both eyes 2 (two) times daily. 11/24/19   Janellie Tennison, Britta Mccreedy, MD  predniSONE (DELTASONE) 10 MG tablet Take 1 tablet (10 mg total) by mouth daily for 5 days. 11/24/19 11/29/19  LampteyBritta Mccreedy, MD    Family History History reviewed. No pertinent family history.  Social History Social History   Tobacco Use  . Smoking status: Never Smoker  . Smokeless tobacco: Never Used  Substance Use Topics  . Alcohol use: Yes  . Drug use: No     Allergies   Patient has no known allergies.   Review of Systems Review of Systems  Constitutional: Negative.   HENT: Positive for rhinorrhea. Negative for nosebleeds and sore throat.   Eyes: Positive for discharge and itching. Negative for photophobia, pain, redness and visual disturbance.  Respiratory: Negative for chest tightness and shortness of breath.   Cardiovascular: Negative for chest pain and palpitations.  Gastrointestinal: Negative for abdominal pain, diarrhea, nausea  and vomiting.     Physical Exam Triage Vital Signs ED Triage Vitals  Enc Vitals Group     BP 11/24/19 1215 (!) 183/131     Pulse Rate 11/24/19 1215 86     Resp 11/24/19 1215 18     Temp 11/24/19 1215 98.2 F (36.8 C)     Temp Source 11/24/19 1215 Oral     SpO2 11/24/19 1215 100 %     Weight 11/24/19 1221 193 lb 3.2 oz (87.6 kg)     Height --      Head Circumference --      Peak Flow --      Pain Score 11/24/19 1221 6     Pain Loc --      Pain Edu? --      Excl. in GC? --    No data  found.  Updated Vital Signs BP (!) 183/131 (BP Location: Right Arm)   Pulse 86   Temp 98.2 F (36.8 C) (Oral)   Resp 18   Wt 87.6 kg   SpO2 100%   Visual Acuity Right Eye Distance:   Left Eye Distance:   Bilateral Distance:    Right Eye Near:   Left Eye Near:    Bilateral Near:     Physical Exam Vitals and nursing note reviewed.  Constitutional:      General: He is not in acute distress.    Appearance: He is not ill-appearing.  HENT:     Right Ear: Tympanic membrane normal.     Left Ear: Tympanic membrane normal.     Nose: Nose normal. No rhinorrhea.     Mouth/Throat:     Mouth: Mucous membranes are moist.     Pharynx: No oropharyngeal exudate or posterior oropharyngeal erythema.  Eyes:     General:        Right eye: Discharge present.        Left eye: Discharge present.    Conjunctiva/sclera: Conjunctivae normal.  Cardiovascular:     Rate and Rhythm: Normal rate and regular rhythm.     Pulses: Normal pulses.     Heart sounds: Normal heart sounds. No murmur. No friction rub. No gallop.   Pulmonary:     Effort: Pulmonary effort is normal. No respiratory distress.     Breath sounds: Normal breath sounds. No wheezing or rhonchi.  Abdominal:     General: Bowel sounds are normal.     Tenderness: There is no abdominal tenderness. There is no guarding or rebound.  Musculoskeletal:        General: No swelling, tenderness or signs of injury. Normal range of motion.     Cervical back: Normal range of motion.  Skin:    General: Skin is warm.     Capillary Refill: Capillary refill takes less than 2 seconds.  Neurological:     General: No focal deficit present.     Mental Status: He is oriented to person, place, and time.  Psychiatric:        Mood and Affect: Mood normal.        Behavior: Behavior normal.      UC Treatments / Results  Labs (all labs ordered are listed, but only abnormal results are displayed) Labs Reviewed - No data to  display  EKG   Radiology No results found.  Procedures Procedures (including critical care time)  Medications Ordered in UC Medications - No data to display  Initial Impression / Assessment and Plan / UC Course  I have reviewed the triage vital signs and the nursing notes.  Pertinent labs & imaging results that were available during my care of the patient were reviewed by me and considered in my medical decision making (see chart for details).     1.  Allergic conjunctivitis and rhinitis: Patient has tried Zyrtec with no improvement We will start patient on Singulair 10 mg orally daily Fluticasone nasal spray Patanol ophthalmic solution Short course of steroids Return precautions given Patient was counseled to start Singulair a month before his allergy starts since that will help decrease the severity of his symptoms  2.  Hypertensive urgency: We had a lengthy discussion about the need to stay on antihypertensive agents.  Patient is not willing to start antihypertensive agents at this time.  He denies any headaches, shortness of breath, cough, chest pain or abdominal pain.  I spent a total of 33 minutes with this patient with more than 50% of the time dedicated to face-to-face encounter with counseling regarding starting antihypertensive medications. Final Clinical Impressions(s) / UC Diagnoses   Final diagnoses:  Allergic conjunctivitis and rhinitis, bilateral  Hypertensive urgency   Discharge Instructions   None    ED Prescriptions    Medication Sig Dispense Auth. Provider   fluticasone (FLONASE) 50 MCG/ACT nasal spray Place 2 sprays into both nostrils daily. 1 g Shakai Dolley, Myrene Galas, MD   montelukast (SINGULAIR) 10 MG tablet Take 1 tablet (10 mg total) by mouth at bedtime. 90 tablet Kati Riggenbach, Myrene Galas, MD   predniSONE (DELTASONE) 10 MG tablet Take 1 tablet (10 mg total) by mouth daily for 5 days. 5 tablet Imonie Tuch, Myrene Galas, MD   olopatadine (PATANOL) 0.1 % ophthalmic  solution Place 1 drop into both eyes 2 (two) times daily. 5 mL Aquanetta Schwarz, Myrene Galas, MD     PDMP not reviewed this encounter.   Chase Picket, MD 11/24/19 1438

## 2019-11-24 NOTE — ED Triage Notes (Signed)
Pt state his allergies are flaring up. Eyes watery and runny nose. This has been going on for a month or so.

## 2020-09-18 IMAGING — DX RIGHT HUMERUS - 2+ VIEW
2 series · 2 of 2 positions shown · non-contrast
Comparison: None.

CLINICAL DATA: Pain and swelling after hitting metal pole wall
playing basketball

EXAM:
RIGHT HUMERUS - 2+ VIEW

[humerus ap]
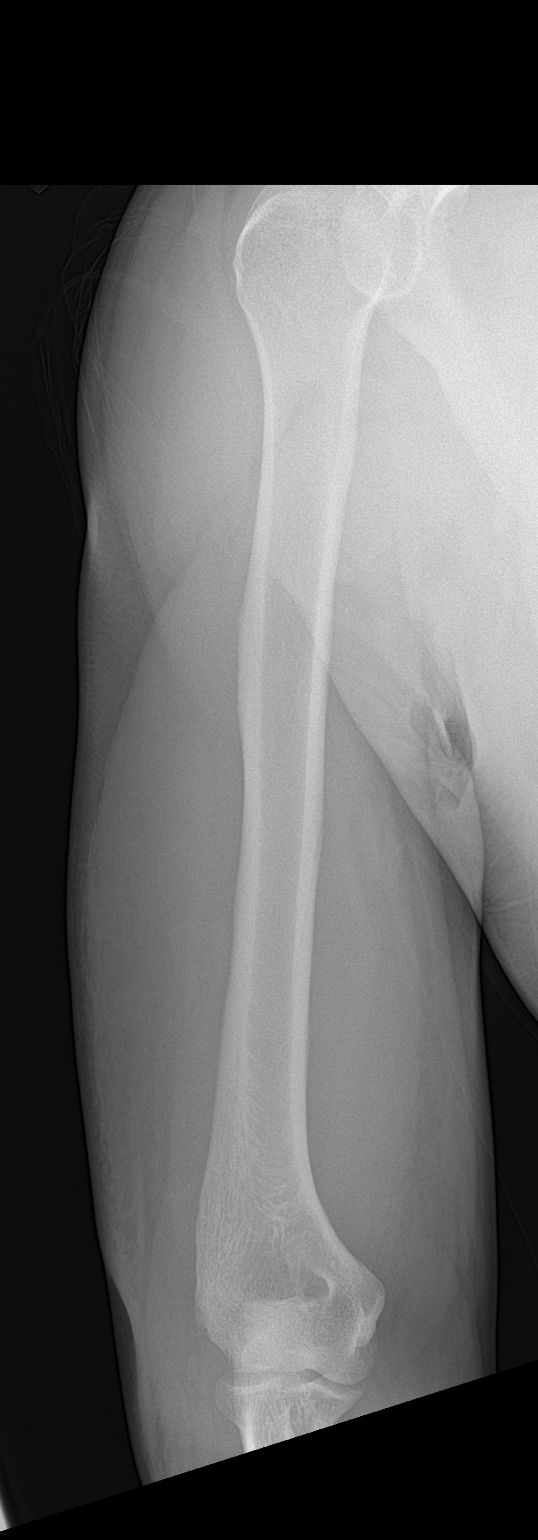

[humerus lat]
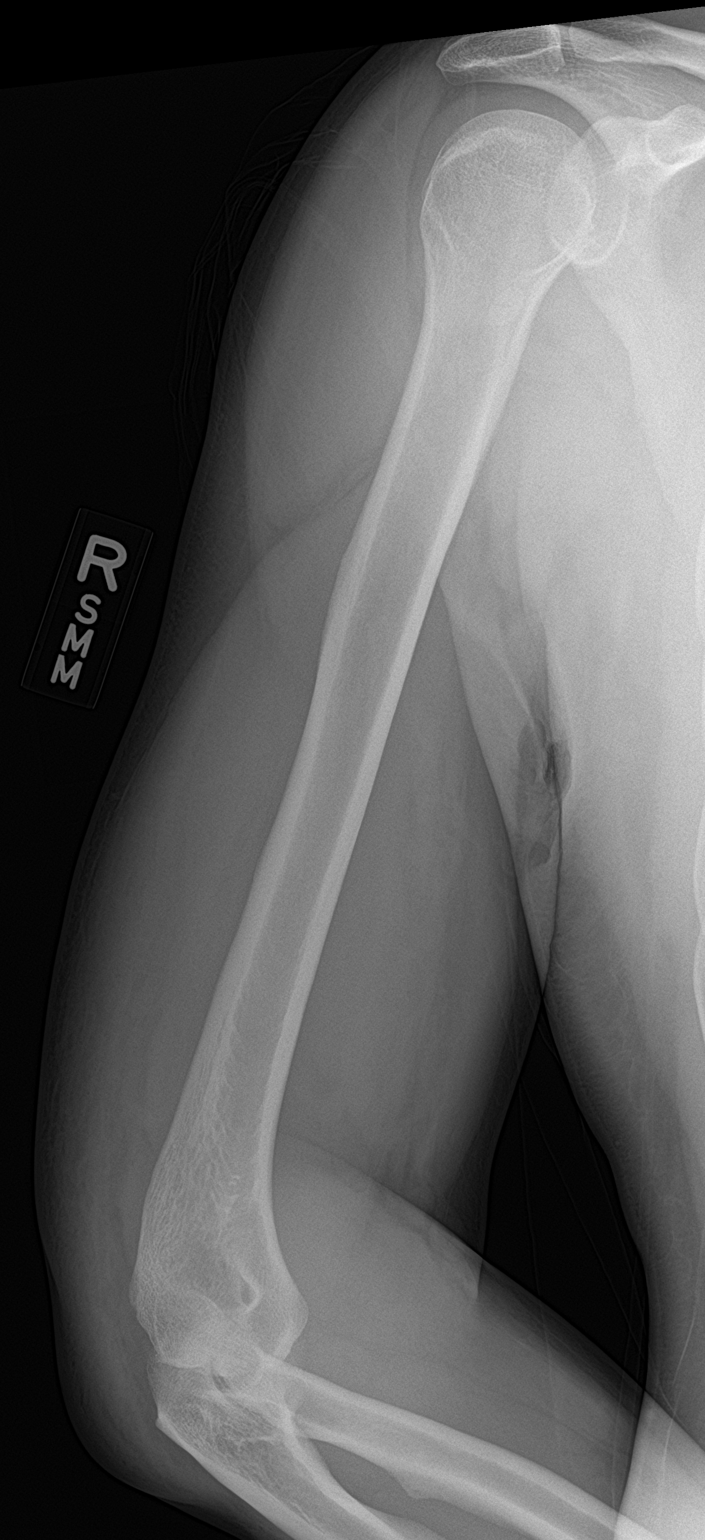

[2 of 2 positions shown; findings below may reference images not displayed]

FINDINGS: Frontal and lateral views were obtained. There is no evident
fracture or dislocation. There is soft tissue swelling distally. No
abnormal periosteal reaction. Joint spaces appear normal.
IMPRESSION: Soft tissue swelling distally. No fracture or dislocation. No
appreciable arthropathy.

## 2022-04-01 ENCOUNTER — Encounter (HOSPITAL_COMMUNITY): Payer: Self-pay | Admitting: Emergency Medicine

## 2022-04-01 ENCOUNTER — Other Ambulatory Visit: Payer: Self-pay

## 2022-04-01 ENCOUNTER — Ambulatory Visit (HOSPITAL_COMMUNITY)
Admission: EM | Admit: 2022-04-01 | Discharge: 2022-04-01 | Disposition: A | Discharge status: Home / Self Care | Payer: Self-pay | Attending: Family Medicine | Admitting: Family Medicine

## 2022-04-01 DIAGNOSIS — T63441A Toxic effect of venom of bees, accidental (unintentional), initial encounter: Secondary | ICD-10-CM

## 2022-04-01 DIAGNOSIS — T782XXA Anaphylactic shock, unspecified, initial encounter: Secondary | ICD-10-CM

## 2022-04-01 MED ORDER — DIPHENHYDRAMINE HCL 50 MG/ML IJ SOLN
50.0000 mg | Freq: Once | INTRAMUSCULAR | Status: AC
  Administered 2022-04-01: 50 mg via INTRAMUSCULAR

## 2022-04-01 MED ORDER — SODIUM CHLORIDE 0.9 % IV SOLN: Freq: Once | INTRAVENOUS | Status: AC

## 2022-04-01 MED ORDER — METHYLPREDNISOLONE SODIUM SUCC 125 MG IJ SOLR
INTRAMUSCULAR | Status: AC
  Filled 2022-04-01: qty 2

## 2022-04-01 MED ORDER — DIPHENHYDRAMINE HCL 50 MG/ML IJ SOLN
INTRAMUSCULAR | Status: AC
  Filled 2022-04-01: qty 1

## 2022-04-01 MED ORDER — EPINEPHRINE 0.3 MG/0.3ML IJ SOAJ
0.3000 mg | Freq: Once | INTRAMUSCULAR | Status: AC
  Administered 2022-04-01: 0.3 mg via SUBCUTANEOUS

## 2022-04-01 MED ORDER — METHYLPREDNISOLONE SODIUM SUCC 125 MG IJ SOLR
125.0000 mg | Freq: Once | INTRAMUSCULAR | Status: AC
  Administered 2022-04-01: 125 mg via INTRAMUSCULAR

## 2022-04-01 NOTE — ED Triage Notes (Signed)
Walked across yellow jacket, multiple stings.  Not sure how many .  Patient reports facial swelling, lips.  Able to breath, able to swallow.  Patient is speaking in complete sentences, answering questions.  Patient has hives and itching

## 2022-04-01 NOTE — ED Notes (Signed)
Patient is being discharged from the Urgent Care and sent to the Emergency Department via [facility] . Per Jerrilyn Cairo, NP, patient is in need of higher level of care due to allergic reaction. Patient is aware and verbalizes understanding of plan of care.  Vitals:   04/01/22 1757  BP: (!) 152/95  Pulse: (!) 122  Resp: (!) 24  Temp: 98 F (36.7 C)  SpO2: 94%

## 2022-04-01 NOTE — ED Notes (Signed)
Patient denies any difficulty breathing.  Rash is worsening.  No worsening of facial swelling.  Patient speaking in complete sentences, skin warm and dry.

## 2022-04-01 NOTE — ED Provider Notes (Signed)
[facility]    CSN: [id number] Arrival date & time: 04/01/22  1751      History   Chief Complaint Chief Complaint  Patient presents with  . Allergic Reaction    HPI Victor Harvey is a 46 y.o. male.   HPI Patient presents with an acute anaphylactic reaction after being stung by multiple bees or yellow jackets.  Patient was outside and is unable to recall how many insects stung him.  He is here today with lip swelling and endorses mild shortness of breath and complains of throat swelling now. No past medical history on file.  There are no problems to display for this patient.   No past surgical history on file.     Home Medications    Prior to Admission medications   Medication Sig Start Date End Date Taking? Authorizing Provider  clarithromycin (BIAXIN) 500 MG tablet Take 1 tablet (500 mg total) by mouth 2 (two) times daily. 02/01/19   Eustace Moore, MD  fluticasone [facility]) 50 MCG/ACT nasal spray Place 2 sprays into both nostrils daily. 11/24/19   Lamptey, Britta Mccreedy, MD  HYDROcodone-acetaminophen (NORCO) 7.5-325 MG tablet Take 1 tablet by mouth every 6 (six) hours as needed for moderate pain. 02/01/19   Eustace Moore, MD  ibuprofen (ADVIL) 800 MG tablet Take 1 tablet (800 mg total) by mouth 3 (three) times daily. 02/01/19   Eustace Moore, MD  ipratropium (ATROVENT) 0.06 % nasal spray Place 2 sprays into both nostrils 4 (four) times daily. 08/08/18   Cathie Hoops, Amy V, PA-C  montelukast (SINGULAIR) 10 MG tablet Take 1 tablet (10 mg total) by mouth at bedtime. 11/24/19   Lamptey, Britta Mccreedy, MD  olopatadine (PATANOL) 0.1 % ophthalmic solution Place 1 drop into both eyes 2 (two) times daily. 11/24/19   Lamptey, Britta Mccreedy, MD    Family History No family history on file.  Social History Social History   Tobacco Use  . Smoking status: Never  . Smokeless tobacco: Never  Vaping Use  . Vaping Use: Never used  Substance Use Topics  . Alcohol use: Yes  . Drug  use: No     Allergies   Patient has no known allergies.   Review of Systems Review of Systems   Physical Exam Triage Vital Signs ED Triage Vitals [04/01/22 1757]  Enc Vitals Group     BP (!) 152/95     Pulse Rate (!) 122     Resp (!) 24     Temp 98 F (36.7 C)     Temp Source Oral     SpO2 94 %     Weight      Height      Head Circumference      Peak Flow      Pain Score      Pain Loc      Pain Edu?      Excl. in GC?    No data found.  Updated Vital Signs BP (!) 152/95 (BP Location: Right Arm)   Pulse (!) 122   Temp 98 F (36.7 C) (Oral)   Resp (!) 24   SpO2 94%   Visual Acuity Right Eye Distance:   Left Eye Distance:   Bilateral Distance:    Right Eye Near:   Left Eye Near:    Bilateral Near:     Physical Exam   UC Treatments / Results  Labs (all labs ordered are listed, but only abnormal results are  displayed) Labs Reviewed - No data to display  EKG   Radiology No results found.  Procedures Procedures (including critical care time)  Medications Ordered in UC Medications  diphenhydrAMINE (BENADRYL) injection 50 mg (has no administration in time range)  methylPREDNISolone sodium succinate (SOLU-MEDROL) 125 mg/2 mL injection 125 mg (has no administration in time range)  EPINEPHrine (EPI-PEN) injection 0.3 mg (0.3 mg Subcutaneous Given 04/01/22 1801)    Initial Impression / Assessment and Plan / UC Course  I have reviewed the triage vital signs and the nursing notes.  Pertinent labs & imaging results that were available during my care of the patient were reviewed by me and considered in my medical decision making (see chart for details).     *** Final Clinical Impressions(s) / UC Diagnoses   Final diagnoses:  None   Discharge Instructions   None    ED Prescriptions   None    PDMP not reviewed this encounter.

## 2022-05-30 ENCOUNTER — Ambulatory Visit (HOSPITAL_COMMUNITY)
Admission: EM | Admit: 2022-05-30 | Discharge: 2022-05-30 | Disposition: A | Discharge status: Home / Self Care | Payer: Self-pay | Attending: Family Medicine | Admitting: Family Medicine

## 2022-05-30 ENCOUNTER — Encounter (HOSPITAL_COMMUNITY): Payer: Self-pay

## 2022-05-30 DIAGNOSIS — B029 Zoster without complications: Secondary | ICD-10-CM

## 2022-05-30 MED ORDER — PREDNISONE 20 MG PO TABS: 40.0000 mg | ORAL_TABLET | Freq: Every day | ORAL | 0 refills | Status: DC

## 2022-05-30 MED ORDER — PREDNISONE 20 MG PO TABS: 40.0000 mg | ORAL_TABLET | Freq: Every day | ORAL | 0 refills | Status: AC

## 2022-05-30 MED ORDER — VALACYCLOVIR HCL 1 G PO TABS: 1000.0000 mg | ORAL_TABLET | Freq: Three times a day (TID) | ORAL | 0 refills | Status: AC

## 2022-05-30 MED ORDER — VALACYCLOVIR HCL 1 G PO TABS: 1000.0000 mg | ORAL_TABLET | Freq: Three times a day (TID) | ORAL | 0 refills | Status: DC

## 2022-05-30 NOTE — Discharge Instructions (Addendum)
Take prednisone 20 mg--2 daily for 5 days  Take valacyclovir 1000 mg--1 tablet 3 times daily for 7 days

## 2022-05-30 NOTE — ED Triage Notes (Signed)
Pt reports insect bite a couple days ago. He reports some swelling and tingling around his right arm.

## 2022-05-30 NOTE — ED Provider Notes (Addendum)
[facility]    CSN: [id number] Arrival date & time: 05/30/22  1319      History   Chief Complaint Chief Complaint  Patient presents with   Allergic Reaction    HPI Victor Harvey is a 46 y.o. male.    Allergic Reaction  Here for tingling and soreness in his right upper arm and right back.  Symptoms have been going on for 2 to 3 days.  He has become aware of some rash type areas on his posterior right shoulder, his right upper chest and his right medial arm.  He also has a lesion or 2 in his posterior right axilla.    There is not really any itching.  No trouble breathing and no swelling of his lips or tongue.  He came in stating that he thought he had been bitten by a spider, but he actually does not have any memory of a time where he felt like he was being bitten by an insect or spider.   Past Medical History:  Diagnosis Date   Hypertension     There are no problems to display for this patient.   History reviewed. No pertinent surgical history.     Home Medications    Prior to Admission medications   Medication Sig Start Date End Date Taking? Authorizing Provider  predniSONE (DELTASONE) 20 MG tablet Take 2 tablets (40 mg total) by mouth daily with breakfast for 5 days. 05/30/22 06/04/22 Yes Marlynn Hinckley, Janace Aris, MD  valACYclovir (VALTREX) 1000 MG tablet Take 1 tablet (1,000 mg total) by mouth 3 (three) times daily for 7 days. 05/30/22 06/06/22 Yes Zenia Resides, MD  fluticasone (FLONASE) 50 MCG/ACT nasal spray Place 2 sprays into both nostrils daily. 11/24/19   Lamptey, Britta Mccreedy, MD  ipratropium (ATROVENT) 0.06 % nasal spray Place 2 sprays into both nostrils 4 (four) times daily. 08/08/18   Cathie Hoops, Amy V, PA-C  montelukast (SINGULAIR) 10 MG tablet Take 1 tablet (10 mg total) by mouth at bedtime. 11/24/19   Lamptey, Britta Mccreedy, MD  rosuvastatin (CRESTOR) 5 MG tablet Take 5 mg by mouth daily. 02/16/22   [provider]    Family History History  reviewed. No pertinent family history.  Social History Social History   Tobacco Use   Smoking status: Never   Smokeless tobacco: Never  Vaping Use   Vaping Use: Never used  Substance Use Topics   Alcohol use: Yes   Drug use: No     Allergies   Patient has no known allergies.   Review of Systems Review of Systems   Physical Exam Triage Vital Signs ED Triage Vitals [05/30/22 1418]  Enc Vitals Group     BP (!) 177/110     Pulse Rate 87     Resp 18     Temp 98.3 F (36.8 C)     Temp Source Oral     SpO2 98 %     Weight      Height      Head Circumference      Peak Flow      Pain Score      Pain Loc      Pain Edu?      Excl. in GC?    No data found.  Updated Vital Signs BP (!) 164/119 (BP Location: Left Arm)   Pulse 87   Temp 98.3 F (36.8 C) (Oral)   Resp 18   SpO2 95%   Visual Acuity Right  Eye Distance:   Left Eye Distance:   Bilateral Distance:    Right Eye Near:   Left Eye Near:    Bilateral Near:     Physical Exam Vitals reviewed.  Constitutional:      General: He is not in acute distress.    Appearance: He is not ill-appearing, toxic-appearing or diaphoretic.  HENT:     Mouth/Throat:     Mouth: Mucous membranes are moist.  Eyes:     Extraocular Movements: Extraocular movements intact.     Pupils: Pupils are equal, round, and reactive to light.  Cardiovascular:     Rate and Rhythm: Normal rate and regular rhythm.     Heart sounds: No murmur heard. Pulmonary:     Effort: Pulmonary effort is normal.     Breath sounds: No stridor. No rhonchi or rales.  Musculoskeletal:     Cervical back: Neck supple.  Skin:    Coloration: Skin is not jaundiced or pale.     Comments: There is rash with an erythematous base with bumps about 0.5 cm diameter.  They look like they are developing into vesicles.  The rash is about 3 cm diameter in total on the right posterior shoulder, 2.5 cm on right chest, 3 cm on right medial upper arm, and 1.5 cm on  right posterior axilla. No sign of secondary infection   Neurological:     General: No focal deficit present.     Mental Status: He is alert and oriented to person, place, and time.      UC Treatments / Results  Labs (all labs ordered are listed, but only abnormal results are displayed) Labs Reviewed - No data to display  EKG   Radiology No results found.  Procedures Procedures (including critical care time)  Medications Ordered in UC Medications - No data to display  Initial Impression / Assessment and Plan / UC Course  I have reviewed the triage vital signs and the nursing notes.  Pertinent labs & imaging results that were available during my care of the patient were reviewed by me and considered in my medical decision making (see chart for details).     I showed him a dermatome map and explained why I think this is early shingles.  He is treated with Valtrex, and due to his concern about envenomation and reaction, he is also treated with prednisone.      Final Clinical Impressions(s) / UC Diagnoses   Final diagnoses:  Herpes zoster without complication     Discharge Instructions      Take prednisone 20 mg--2 daily for 5 days  Take valacyclovir 1000 mg--1 tablet 3 times daily for 7 days     ED Prescriptions     Medication Sig Dispense Auth. Provider   predniSONE (DELTASONE) 20 MG tablet Take 2 tablets (40 mg total) by mouth daily with breakfast for 5 days. 10 tablet Zenia Resides, MD   valACYclovir (VALTREX) 1000 MG tablet Take 1 tablet (1,000 mg total) by mouth 3 (three) times daily for 7 days. 21 tablet Anice Wilshire, Janace Aris, MD      PDMP not reviewed this encounter.   Zenia Resides, MD 05/30/22 1445    Zenia Resides, MD 05/30/22 [phone]
